# Patient Record
Sex: Male | Born: 1955
Health system: Southern US, Community
[De-identification: ages and names within clinical notes are randomized; demographics above are authoritative.]

## PROBLEM LIST (undated history)

## (undated) DIAGNOSIS — M1990 Unspecified osteoarthritis, unspecified site: Secondary | ICD-10-CM

## (undated) DIAGNOSIS — K219 Gastro-esophageal reflux disease without esophagitis: Secondary | ICD-10-CM

## (undated) DIAGNOSIS — I10 Essential (primary) hypertension: Secondary | ICD-10-CM

## (undated) DIAGNOSIS — E785 Hyperlipidemia, unspecified: Secondary | ICD-10-CM

## (undated) DIAGNOSIS — Z720 Tobacco use: Secondary | ICD-10-CM

## (undated) DIAGNOSIS — E119 Type 2 diabetes mellitus without complications: Secondary | ICD-10-CM

## (undated) DIAGNOSIS — T4145XA Adverse effect of unspecified anesthetic, initial encounter: Secondary | ICD-10-CM

## (undated) DIAGNOSIS — K602 Anal fissure, unspecified: Secondary | ICD-10-CM

## (undated) DIAGNOSIS — M79606 Pain in leg, unspecified: Secondary | ICD-10-CM

## (undated) DIAGNOSIS — E871 Hypo-osmolality and hyponatremia: Secondary | ICD-10-CM

## (undated) DIAGNOSIS — R112 Nausea with vomiting, unspecified: Secondary | ICD-10-CM

## (undated) DIAGNOSIS — Z9889 Other specified postprocedural states: Secondary | ICD-10-CM

## (undated) DIAGNOSIS — F419 Anxiety disorder, unspecified: Secondary | ICD-10-CM

## (undated) DIAGNOSIS — R739 Hyperglycemia, unspecified: Secondary | ICD-10-CM

## (undated) DIAGNOSIS — T8859XA Other complications of anesthesia, initial encounter: Secondary | ICD-10-CM

## (undated) DIAGNOSIS — T7840XA Allergy, unspecified, initial encounter: Secondary | ICD-10-CM

## (undated) DIAGNOSIS — K859 Acute pancreatitis without necrosis or infection, unspecified: Secondary | ICD-10-CM

## (undated) HISTORY — DX: Essential (primary) hypertension: I10

## (undated) HISTORY — DX: Tobacco use: Z72.0

## (undated) HISTORY — PX: HERNIA REPAIR: SHX51

## (undated) HISTORY — DX: Anal fissure, unspecified: K60.2

## (undated) HISTORY — DX: Allergy, unspecified, initial encounter: T78.40XA

## (undated) HISTORY — DX: Unspecified osteoarthritis, unspecified site: M19.90

## (undated) HISTORY — DX: Hyperlipidemia, unspecified: E78.5

## (undated) HISTORY — DX: Gastro-esophageal reflux disease without esophagitis: K21.9

## (undated) HISTORY — DX: Anxiety disorder, unspecified: F41.9

## (undated) HISTORY — DX: Pain in leg, unspecified: M79.606

## (undated) HISTORY — PX: TIBIA FRACTURE SURGERY: SHX806

## (undated) HISTORY — PX: LASER ABLATION: SHX1947

---

## 2010-09-17 ENCOUNTER — Other Ambulatory Visit: Payer: Self-pay | Admitting: Geriatric Medicine

## 2010-09-17 ENCOUNTER — Ambulatory Visit
Admission: RE | Admit: 2010-09-17 | Discharge: 2010-09-17 | Disposition: A | Discharge status: Home / Self Care | Payer: Medicaid Other | Source: Ambulatory Visit | Attending: Geriatric Medicine | Admitting: Geriatric Medicine

## 2010-09-17 DIAGNOSIS — R52 Pain, unspecified: Secondary | ICD-10-CM

## 2011-02-02 ENCOUNTER — Other Ambulatory Visit: Payer: Self-pay | Admitting: Geriatric Medicine

## 2011-02-02 DIAGNOSIS — G43909 Migraine, unspecified, not intractable, without status migrainosus: Secondary | ICD-10-CM

## 2011-02-03 ENCOUNTER — Ambulatory Visit
Admission: RE | Admit: 2011-02-03 | Discharge: 2011-02-03 | Disposition: A | Discharge status: Home / Self Care | Payer: Medicaid Other | Source: Ambulatory Visit | Attending: Geriatric Medicine | Admitting: Geriatric Medicine

## 2011-02-03 DIAGNOSIS — G43909 Migraine, unspecified, not intractable, without status migrainosus: Secondary | ICD-10-CM

## 2011-07-16 ENCOUNTER — Other Ambulatory Visit: Payer: Self-pay

## 2011-07-16 DIAGNOSIS — I83893 Varicose veins of bilateral lower extremities with other complications: Secondary | ICD-10-CM

## 2011-08-20 ENCOUNTER — Encounter: Payer: Self-pay | Admitting: Vascular Surgery

## 2011-08-23 ENCOUNTER — Encounter (INDEPENDENT_AMBULATORY_CARE_PROVIDER_SITE_OTHER): Payer: Medicare Other | Admitting: *Deleted

## 2011-08-23 ENCOUNTER — Ambulatory Visit (INDEPENDENT_AMBULATORY_CARE_PROVIDER_SITE_OTHER): Payer: Medicare Other | Admitting: Vascular Surgery

## 2011-08-23 ENCOUNTER — Encounter: Payer: Self-pay | Admitting: Vascular Surgery

## 2011-08-23 VITALS — BP 146/98 | HR 60 | Resp 16 | Ht 71.0 in | Wt 190.0 lb

## 2011-08-23 DIAGNOSIS — I83893 Varicose veins of bilateral lower extremities with other complications: Secondary | ICD-10-CM

## 2011-08-23 DIAGNOSIS — M79609 Pain in unspecified limb: Secondary | ICD-10-CM

## 2011-08-23 NOTE — Progress Notes (Signed)
Subjective:     Patient ID: Curtis Kim, male   DOB: 1955/10/31, 56 y.o.   MRN: [id number]  HPI this 56 year old male was referred for severe varicose veins in the right leg. He has no history of DVT, thrombophlebitis, bleeding, stasis ulcers, or other complications. He has had large bulging varicose veins in his medial and posterior thigh and posterior calf for the past 20 years which have continued to enlarge and become increasingly symptomatic. He has aching, throbbing, burning, and stinging discomfort which worsens as the day progresses. It is not wear elastic compression stockings nor elevate his legs on a regular basis nor take pain medicine. He has no symptoms in the contralateral left leg. He denies claudication symptoms. He has had no previous evaluation of his venous disease.  Past Medical History  Diagnosis Date  . Hypertension     History  Substance Use Topics  . Smoking status: Current Everyday Smoker -- 0.5 packs/day  . Smokeless tobacco: Not on file  . Alcohol Use: No    History reviewed. No pertinent family history.  No Known Allergies  Current outpatient prescriptions:gabapentin (NEURONTIN) 300 MG capsule, Take 300 mg by mouth 2 (two) times daily., Disp: , Rfl: ;  ranitidine (ZANTAC) 300 MG tablet, Take 300 mg by mouth 2 (two) times daily., Disp: , Rfl: ;  traMADol (ULTRAM) 50 MG tablet, Take 50 mg by mouth every 6 (six) hours as needed., Disp: , Rfl:   BP 146/98  Pulse 60  Resp 16  Ht 5\' 11"  (1.803 m)  Wt 190 lb (86.183 kg)  BMI 26.50 kg/m2  Body mass index is 26.50 kg/(m^2).         Review of Systems denies chest pain, dyspnea on exertion, PND, orthopnea, chronic bronchitis, hemoptysis, lateralizing neurologic symptoms.    Objective:   Physical Exam blood pressure 146/98 heart rate 60 respirations 16 Gen.-alert and oriented x3 in no apparent distress HEENT normal for age Lungs no rhonchi or wheezing Cardiovascular regular rhythm no murmurs carotid  pulses 3+ palpable no bruits audible Abdomen soft nontender no palpable masses Musculoskeletal free of  major deformities Skin clear -no rashes Neurologic normal Lower extremities 3+ femoral and dorsalis pedis pulses palpable bilaterally with no edema Right leg has large nest of bulging varicosities beginning in the lower third of the thigh medially extending posteriorly into the distal thigh. These are large and mildly tender to touch. There is no erythema. He also has a large nest of bulging varicosities in the right posterior calf. There is no hyperpigmentation or ulceration noted. Left leg is free varicosities.  Today I ordered a venous duplex exam of the right leg which are reviewed and interpreted. There is no DVT or deep venous reflux. He has gross reflux in the right great saphenous and small saphenous veins supplying these bulging varicosities.       Assessment:     Severe venous insufficiency right leg was symptomatic bulging varicosities do to reflux in right great and small saphenous veins    Plan:     #1 long-leg elastic compression stockings #2 elevate legs during the day as much as possible #3 ibuprofen on a daily basis #4 return in 3 months. If no significant improvement he will need #1 laser ablation right great saphenous vein followed by #2 laser ablation right small saphenous vein with greater than 20 stab phlebectomy

## 2011-09-06 NOTE — Procedures (Unsigned)
LOWER EXTREMITY VENOUS REFLUX EXAM  INDICATION:  Right lower extremity pain.  EXAM:  Using color-flow imaging and pulse Doppler spectral analysis, the right common femoral, femoral, popliteal, posterior tibial, great and small saphenous veins were evaluated.  There is no evidence suggesting deep venous insufficiency in the right lower extremity.  The right saphenofemoral junction and non-tortuous great saphenous veins demonstrate Reflux of >560millisecondswith calibers as described below.   The right proximal small saphenous vein demonstrates Reflux of >560milliseconds with diameter measurements ranging from 0.26 to 0.82 cm.  GSV Diameter (used if found to be incompetent only)                                           Right    Left Proximal Greater Saphenous Vein           0.64 cm  cm Proximal-to-mid-thigh                     cm       cm Mid thigh                                 0.67 cm  cm Mid-distal thigh                          cm       cm Distal thigh                              0.26 cm  cm Knee                                      0.27 cm  cm  IMPRESSION: 1. Right great and small saphenous vein Reflux, as described above. 2. The deep venous system is not competent.  ___________________________________________ Curtis Kim. Hart Rochester, M.D.  CH/MEDQ  D:  08/25/2011  T:  08/25/2011  Job:  [id number]

## 2011-10-19 ENCOUNTER — Encounter: Payer: Medicaid Other | Admitting: Vascular Surgery

## 2011-10-21 ENCOUNTER — Encounter: Payer: Self-pay | Admitting: Gastroenterology

## 2011-11-19 ENCOUNTER — Encounter: Payer: Self-pay | Admitting: Vascular Surgery

## 2011-11-22 ENCOUNTER — Ambulatory Visit (INDEPENDENT_AMBULATORY_CARE_PROVIDER_SITE_OTHER): Payer: Medicare Other | Admitting: Vascular Surgery

## 2011-11-22 ENCOUNTER — Encounter: Payer: Self-pay | Admitting: Vascular Surgery

## 2011-11-22 VITALS — BP 155/98 | HR 68 | Resp 20 | Ht 71.0 in | Wt 186.0 lb

## 2011-11-22 DIAGNOSIS — I83893 Varicose veins of bilateral lower extremities with other complications: Secondary | ICD-10-CM

## 2011-11-22 NOTE — Progress Notes (Signed)
Subjective:     Patient ID: Curtis Kim, male   DOB: September 03, 1955, 56 y.o.   MRN: [id number]  HPI this 56 year old male returns after 3 months of treatment for his severe bulging varicose veins in the right leg which had been present and enlarging over the past 20 years. He has tried long-leg elastic compression stockings 20-30 mm gradient as well as elevation. He cannot take ibuprofen but is on pain medication. He continues to have slow enlargement of these large bulging varicosities and aching throbbing and burning discomfort which worsens as the day progresses. This is affecting his daily living.  Past Medical History  Diagnosis Date  . Hypertension     History  Substance Use Topics  . Smoking status: Current Every Day Smoker -- 0.5 packs/day  . Smokeless tobacco: Not on file  . Alcohol Use: No    No family history on file.  No Known Allergies  Current outpatient prescriptions:ranitidine (ZANTAC) 300 MG tablet, Take 300 mg by mouth 2 (two) times daily., Disp: , Rfl: ;  traMADol (ULTRAM) 50 MG tablet, Take 50 mg by mouth every 6 (six) hours as needed., Disp: , Rfl: ;  gabapentin (NEURONTIN) 300 MG capsule, Take 300 mg by mouth 2 (two) times daily., Disp: , Rfl:   BP 155/98  Pulse 68  Resp 20  Ht 5\' 11"  (1.803 m)  Wt 186 lb (84.369 kg)  BMI 25.94 kg/m2  Body mass index is 25.94 kg/(m^2).           Review of Systems denies chest pain, dyspnea on exertion, PND, orthopnea, hemoptysis, claudication.     Objective:   Physical Exam blood pressure 150/98 heart rate 68 respirations 20 General well-developed well-nourished male in no apparent stress alert and oriented x3 Lungs no rhonchi or wheezing Right lower extremity with large bulging varicosities beginning in the lower third of the thigh extending posteriorly across the popliteal fossa and into the posterior calf. These are quite extensive. He has 1+ distal edema. 3+ dorsalis pedis pulses present.  Patient has  documented gross reflux right great and small saphenous vein supplying these large bulging varicosities     Assessment:     Severe venous insufficiency right leg with gross reflux right great and small saphenous vein supplying the large bulging varicosities-resistant to conservative measures-affecting patient's daily living    Plan:     Patient needs #1 laser ablation right great saphenous vein followed by #2 laser ablation right small saphenous vein with greater than 20 stab phlebectomy as single procedure

## 2011-11-23 ENCOUNTER — Encounter: Payer: Self-pay | Admitting: Gastroenterology

## 2011-11-23 ENCOUNTER — Other Ambulatory Visit: Payer: Self-pay | Admitting: *Deleted

## 2011-11-23 DIAGNOSIS — I83893 Varicose veins of bilateral lower extremities with other complications: Secondary | ICD-10-CM

## 2011-11-29 ENCOUNTER — Telehealth: Payer: Self-pay | Admitting: *Deleted

## 2011-11-29 NOTE — Telephone Encounter (Signed)
Patient direct sent by Dr.Hooper for screening colonoscopy. Patient denies any GI complaints or problems. Patient does states family history colon cancer in father at age 54 and mother stomach cancer. Patient states last colonoscopy was at age 20,3 years ago at [facility] while he was incarcerated. He believes he had polyps removed but not sure how many. After I explained this to Dr.Stark verbal order given to cancel procedure and get records from [facility] for review before colonoscopy done. Explained to patient. Encouraged patient to call us back if he has not heard from [place] in 2 weeks. Release of information completed and signed by patient and given to Amanda,CMA.

## 2011-11-30 NOTE — Telephone Encounter (Signed)
Faxed record release to [employer] and awaiting on records.

## 2011-12-02 ENCOUNTER — Telehealth: Payer: Self-pay | Admitting: Gastroenterology

## 2011-12-02 NOTE — Telephone Encounter (Signed)
Recall entered  °

## 2011-12-02 NOTE — Telephone Encounter (Signed)
Records from prior colonoscopy on 02/20/2008 at [facility] reviewed. He had one small adenomatous polyp, two small hyperplastic polyps and internal/external hemorrhoids. Follow up colonoscopy in 5 years was recommended and I agree with this recommendation. Colon recall 01/2013.

## 2011-12-02 NOTE — Telephone Encounter (Signed)
Forward 6 pages from [facility] to Dr. Claudette Head for review on 12-02-11 ym

## 2011-12-06 ENCOUNTER — Encounter: Payer: Medicaid Other | Admitting: Gastroenterology

## 2011-12-10 ENCOUNTER — Encounter: Payer: Self-pay | Admitting: Vascular Surgery

## 2011-12-13 ENCOUNTER — Encounter: Payer: Self-pay | Admitting: Vascular Surgery

## 2011-12-13 ENCOUNTER — Ambulatory Visit (INDEPENDENT_AMBULATORY_CARE_PROVIDER_SITE_OTHER): Payer: Medicaid Other | Admitting: Vascular Surgery

## 2011-12-13 VITALS — BP 145/85 | HR 70 | Resp 16 | Ht 71.0 in | Wt 186.0 lb

## 2011-12-13 DIAGNOSIS — I83893 Varicose veins of bilateral lower extremities with other complications: Secondary | ICD-10-CM

## 2011-12-13 NOTE — Progress Notes (Signed)
Laser Ablation Procedure      Date: 12/13/2011    Curtis Kim DOB:Mar 13, 1955  Consent signed: Yes  Surgeon:J.D. Hart Rochester  Procedure: Laser Ablation: right Greater Saphenous Vein  BP 145/85  Pulse 70  Resp 16  Ht 5\' 11"  (1.803 m)  Wt 186 lb (84.369 kg)  BMI 25.94 kg/m2  Start time: 9:30   End time: 10:00  Tumescent Anesthesia: 200 cc 0.9% NaCl with 50 cc Lidocaine HCL with 1% Epi and 15 cc 8.4% NaHCO3  Local Anesthesia: 3 cc Lidocaine HCL and NaHCO3 (ratio 2:1)  Pulsed mode: Watts 15 Seconds 1 Pulses:1 Total Pulses:93 Total Energy: 1395 Total Time: 1:33     Patient tolerated procedure well: Yes  Notes:   Description of Procedure:  After marking the course of the saphenous vein and the secondary varicosities in the standing position, the patient was placed on the operating table in the supine position, and the right leg was prepped and draped in sterile fashion. Local anesthetic was administered, and under ultrasound guidance the saphenous vein was accessed with a micro needle and guide wire; then the micro puncture sheath was placed. A guide wire was inserted to the saphenofemoral junction, followed by a 5 french sheath.  The position of the sheath and then the laser fiber below the junction was confirmed using the ultrasound and visualization of the aiming beam.  Tumescent anesthesia was administered along the course of the saphenous vein using ultrasound guidance. Protective laser glasses were placed on the patient, and the laser was fired at 15 watt pulsed mode advancing 1-2 mm per sec.  For a total of 1395 joules.  A steri strip was applied to the puncture site.    ABD pads and thigh high compression stockings were applied.  Ace wrap bandages were applied over the phlebectomy sites and at the top of the saphenofemoral junction.  Blood loss was less than 15 cc.  The patient ambulated out of the operating room having tolerated the procedure well.

## 2011-12-13 NOTE — Progress Notes (Signed)
Subjective:     Patient ID: Curtis Kim, male   DOB: [birth year], 56 y.o.   MRN: [id number]  HPI this 56 year old male had laser ablation of the right great saphenous vein from the distal thigh the saphenofemoral junction under local tumescent anesthesia. A total of 1395 J of energy was utilized. He tolerated the procedure well.  Review of Systems     Objective:   Physical ExamBP 145/85  Pulse 70  Resp 16  Ht 5\' 11"  (1.803 m)  Wt 186 lb (84.369 kg)  BMI 25.94 kg/m2      Assessment:    well-tolerated laser ablation right great saphenous vein performed under local tumescent anesthesia for venous hypertension with painful bulging varicosities    Plan:     Plan return in one week for venous duplex exam to confirm closure right great saphenous vein. Patient will then have laser ablation right small saphenous with multiple stab phlebectomy

## 2011-12-14 ENCOUNTER — Encounter: Payer: Self-pay | Admitting: Vascular Surgery

## 2011-12-14 ENCOUNTER — Telehealth: Payer: Self-pay | Admitting: *Deleted

## 2011-12-14 NOTE — Telephone Encounter (Signed)
Reached the patient's sister. He needed clarification on when the stocking comes off. I went over everything and told her he went home with an instructions sheet. She states he is not in pain and doing well. Reminded her of his fu appt next week.

## 2011-12-20 ENCOUNTER — Encounter: Payer: Self-pay | Admitting: Vascular Surgery

## 2011-12-21 ENCOUNTER — Ambulatory Visit (INDEPENDENT_AMBULATORY_CARE_PROVIDER_SITE_OTHER): Payer: Medicaid Other | Admitting: Vascular Surgery

## 2011-12-21 ENCOUNTER — Encounter (INDEPENDENT_AMBULATORY_CARE_PROVIDER_SITE_OTHER): Payer: Medicaid Other | Admitting: *Deleted

## 2011-12-21 ENCOUNTER — Encounter: Payer: Self-pay | Admitting: Vascular Surgery

## 2011-12-21 ENCOUNTER — Other Ambulatory Visit: Payer: Self-pay | Admitting: *Deleted

## 2011-12-21 VITALS — BP 133/84 | HR 62 | Resp 20 | Ht 71.0 in | Wt 187.0 lb

## 2011-12-21 DIAGNOSIS — I83893 Varicose veins of bilateral lower extremities with other complications: Secondary | ICD-10-CM

## 2011-12-21 DIAGNOSIS — Z48812 Encounter for surgical aftercare following surgery on the circulatory system: Secondary | ICD-10-CM

## 2011-12-21 NOTE — Progress Notes (Signed)
Subjective:     Patient ID: Curtis Kim, male   DOB: December 10, 1955, 56 y.o.   MRN: [id number]  HPI this 56 year old male returns 1 week post laser ablation right great saphenous vein for painful varicosities due to gross reflux. This was performed under local tumescent anesthesia. He has had mild to moderate discomfort along the course of the great saphenous vein from the mid thigh to the saphenofemoral junction. He denies any chest pain dyspnea on exertion or hemoptysis. He has had minimal distal edema since the procedure. He continues to have pain in the posterior calf over the bulging varicosities.  Past Medical History  Diagnosis Date  . Hypertension     History  Substance Use Topics  . Smoking status: Current Every Day Smoker -- 0.5 packs/day  . Smokeless tobacco: Never Used  . Alcohol Use: No    Family History  Problem Relation Age of Onset  . Cancer Mother     colon  . Cancer Father     pancreas    No Known Allergies  Current outpatient prescriptions:gabapentin (NEURONTIN) 300 MG capsule, Take 300 mg by mouth 2 (two) times daily., Disp: , Rfl: ;  ranitidine (ZANTAC) 300 MG tablet, Take 300 mg by mouth 2 (two) times daily., Disp: , Rfl: ;  traMADol (ULTRAM) 50 MG tablet, Take 50 mg by mouth every 6 (six) hours as needed., Disp: , Rfl:   BP 133/84  Pulse 62  Resp 20  Ht 5\' 11"  (1.803 m)  Wt 187 lb (84.823 kg)  BMI 26.08 kg/m2  Body mass index is 26.08 kg/(m^2).          Review of Systems     Objective:   Physical ExamBP 133/84  Pulse 62  Resp 20  Ht 5\' 11"  (1.803 m)  Wt 187 lb (84.823 kg)  BMI 26.08 kg/m2  General well-developed well-nourished male no apparent distress Lungs no rhonchi or wheezing Right lower extremity with moderate tenderness along the course the great saphenous vein from the distal thigh to the saphenofemoral junction. Some bulging varicosities in the medial and posterior thigh from the great saphenous system are thrombosed. The  bulging varicosities over the small saphenous system in the posterior calf continued to be compressible and tense and not thrombosed. No active ulcers are noted.  Today I ordered a venous duplex exam of the right leg which are reviewed and interpreted. Great saphenous vein is totally occluded up to near the saphenofemoral junction with no DVT.     Assessment:     Successful laser ablation right great saphenous vein for gross reflux with painful varicosities and thrombosis of some secondary varicosities and distal thigh    Plan:     Plan laser ablation right small saphenous vein with stab phlebectomy on November 11

## 2012-01-07 ENCOUNTER — Encounter: Payer: Self-pay | Admitting: Vascular Surgery

## 2012-01-10 ENCOUNTER — Encounter: Payer: Self-pay | Admitting: Vascular Surgery

## 2012-01-10 ENCOUNTER — Ambulatory Visit (INDEPENDENT_AMBULATORY_CARE_PROVIDER_SITE_OTHER): Payer: Medicaid Other | Admitting: Vascular Surgery

## 2012-01-10 VITALS — BP 166/103 | HR 80 | Resp 18 | Ht 71.0 in | Wt 187.0 lb

## 2012-01-10 DIAGNOSIS — I83893 Varicose veins of bilateral lower extremities with other complications: Secondary | ICD-10-CM

## 2012-01-10 NOTE — Progress Notes (Signed)
Subjective:     Patient ID: Curtis Kim, male   DOB: October 05, 1955, 56 y.o.   MRN: [id number]  HPI this 56 year old male had laser ablation of the right small saphenous vein from the mid calf to the popliteal fossa performed under local tumescent anesthesia. He also had 10-20 stab phlebectomy of secondary varicosities performed at the same setting. Patient previously had large bulging varicosities in the posterior thigh but following laser ablation of the right great saphenous veins and these thrombosed spontaneously. He tolerated the procedure well.  Review of Systems     Objective:   Physical ExamBP 166/103  Pulse 80  Resp 18  Ht 5\' 11"  (1.803 m)  Wt 187 lb (84.823 kg)  BMI 26.08 kg/m2      Assessment:     Laser ablation right small saphenous vein +10-20 stab phlebectomy performed under local tumescent anesthesia for painful varicosities    Plan:     Return November 19 for venous duplex exam right leg to confirm closure right small saphenous vein.

## 2012-01-10 NOTE — Progress Notes (Signed)
Laser Ablation Procedure      Date: 01/10/2012    Konnor Vondrasek DOB:13-Jul-1955  Consent signed: Yes  Surgeon:J.D. Hart Rochester  Procedure: Laser Ablation: right Small Saphenous Vein  BP 166/103  Pulse 80  Resp 18  Ht 5\' 11"  (1.803 m)  Wt 187 lb (84.823 kg)  BMI 26.08 kg/m2  Start time: 1:00   End time: 2:00  Tumescent Anesthesia: 200 cc 0.9% NaCl with 50 cc Lidocaine HCL with 1% Epi and 15 cc 8.4% NaHCO3  Local Anesthesia: 8 cc Lidocaine HCL and NaHCO3 (ratio 2:1)  Pulsed mode: Watts 15 Seconds 1 Pulses:1 Total Pulses:52 Total Energy: 780 Total Time: :52     Stab Phlebectomy: 10-20 Sites: Calf  Patient tolerated procedure well: Yes  Notes:   Description of Procedure:  After marking the course of the saphenous vein and the secondary varicosities in the standing position, the patient was placed on the operating table in the prone position, and the right leg was prepped and draped in sterile fashion. Local anesthetic was administered, and under ultrasound guidance the saphenous vein was accessed with a micro needle and guide wire; then the micro puncture sheath was placed. A guide wire was inserted to the saphenopopliteal junction, followed by a 5 french sheath.  The position of the sheath and then the laser fiber below the junction was confirmed using the ultrasound and visualization of the aiming beam.  Tumescent anesthesia was administered along the course of the saphenous vein using ultrasound guidance. Protective laser glasses were placed on the patient, and the laser was fired at 15 watt pulsed mode advancing 1-2 mm per sec.  For a total of 780 joules.  A steri strip was applied to the puncture site.  The patient was then put into Trendelenburg position.  Local anesthetic was utilized overlying the marked varicosities.  Ten to 20 stab wounds were made using the tip of an 11 blade; and using the vein hook,  The phlebectomies were performed using a hemostat to avulse these  varicosities.  Adequate hemostasis was achieved, and steri strips were applied to the stab wound.      ABD pads and thigh high compression stockings were applied.  Ace wrap bandages were applied over the phlebectomy sites and at the top of the saphenopopliteal junction.  Blood loss was less than 15 cc.  The patient ambulated out of the operating room having tolerated the procedure well.

## 2012-01-11 ENCOUNTER — Telehealth: Payer: Self-pay | Admitting: *Deleted

## 2012-01-11 ENCOUNTER — Encounter: Payer: Self-pay | Admitting: Vascular Surgery

## 2012-01-11 NOTE — Telephone Encounter (Signed)
Spoke with the patient's sister. He is doing well. A ltitle sore but following all instructions. Talked to her about ice and Ibuprofen. Reminded her of his fu appt next week.

## 2012-01-17 ENCOUNTER — Encounter: Payer: Self-pay | Admitting: Vascular Surgery

## 2012-01-18 ENCOUNTER — Ambulatory Visit (INDEPENDENT_AMBULATORY_CARE_PROVIDER_SITE_OTHER): Payer: Medicaid Other | Admitting: Vascular Surgery

## 2012-01-18 ENCOUNTER — Encounter: Payer: Self-pay | Admitting: Vascular Surgery

## 2012-01-18 ENCOUNTER — Ambulatory Visit (INDEPENDENT_AMBULATORY_CARE_PROVIDER_SITE_OTHER): Payer: Medicare Other | Admitting: Vascular Surgery

## 2012-01-18 VITALS — BP 145/94 | HR 76 | Resp 16 | Ht 71.0 in | Wt 188.0 lb

## 2012-01-18 DIAGNOSIS — I83893 Varicose veins of bilateral lower extremities with other complications: Secondary | ICD-10-CM

## 2012-01-18 DIAGNOSIS — I83819 Varicose veins of unspecified lower extremities with pain: Secondary | ICD-10-CM

## 2012-01-18 DIAGNOSIS — M7989 Other specified soft tissue disorders: Secondary | ICD-10-CM

## 2012-01-18 DIAGNOSIS — I83813 Varicose veins of bilateral lower extremities with pain: Secondary | ICD-10-CM

## 2012-01-18 DIAGNOSIS — M79609 Pain in unspecified limb: Secondary | ICD-10-CM

## 2012-01-18 NOTE — Progress Notes (Signed)
Right lower extremity venous duplex post ablation performed @ [facility] 01/18/2012

## 2012-01-18 NOTE — Progress Notes (Signed)
Subjective:     Patient ID: Curtis Kim, male   DOB: [birth year], 56 y.o.   MRN: [id number]  HPI this 56 year old male returns one week following laser ablation right small saphenous vein plus multiple stab phlebectomy for painful varicosities performed under local tumescent anesthesia. He tolerated the procedure well. He has had mild discomfort in the posterior calf and no significant distal edema. He has been wearing his stocking as prescribed. He had previously undergone laser ablation of the right great saphenous vein. Review of Systems     Objective:   Physical ExamBP 145/94  Pulse 76  Resp 16  Ht 5\' 11"  (1.803 m)  Wt 188 lb (85.276 kg)  BMI 26.22 kg/m2  General well-developed well-nourished male in no apparent stress alert and oriented x3 Right leg examined with nicely healing stab phlebectomy sites posterior calf. Mild discomfort over small saphenous vein. Thrombosed varicosities in the distal posterior thigh connecting with great saphenous system.  Today I ordered a venous duplex exam of the right leg which are viewed and interpreted. A small saphenous and great saphenous veins are totally closed with no DVT.    Assessment:     Successful laser ablation right small and great saphenous vein with multiple stab phlebectomy for painful varicosities    Plan:     Return to see [place] on when necessary basis.

## 2012-11-22 ENCOUNTER — Encounter: Payer: Self-pay | Admitting: Gastroenterology

## 2012-12-25 ENCOUNTER — Encounter: Payer: Self-pay | Admitting: Gastroenterology

## 2013-03-06 ENCOUNTER — Encounter: Payer: Self-pay | Admitting: Gastroenterology

## 2013-03-15 ENCOUNTER — Ambulatory Visit (AMBULATORY_SURGERY_CENTER): Payer: Self-pay | Admitting: *Deleted

## 2013-03-15 VITALS — Ht 71.0 in | Wt 201.4 lb

## 2013-03-15 DIAGNOSIS — Z86010 Personal history of colonic polyps: Secondary | ICD-10-CM

## 2013-03-15 MED ORDER — MOVIPREP 100 G PO SOLR: ORAL | Status: DC

## 2013-03-15 NOTE — Progress Notes (Signed)
No allergies to eggs or soy. No problems with anesthesia.  

## 2013-03-20 ENCOUNTER — Encounter: Payer: Medicare Other | Admitting: Gastroenterology

## 2013-04-11 ENCOUNTER — Encounter: Payer: Self-pay | Admitting: Gastroenterology

## 2013-04-11 ENCOUNTER — Ambulatory Visit (AMBULATORY_SURGERY_CENTER): Payer: Medicare Other | Admitting: Gastroenterology

## 2013-04-11 VITALS — BP 126/83 | HR 68 | Temp 97.8°F | Resp 14 | Ht 71.0 in | Wt 201.0 lb

## 2013-04-11 DIAGNOSIS — D126 Benign neoplasm of colon, unspecified: Secondary | ICD-10-CM

## 2013-04-11 DIAGNOSIS — Z800 Family history of malignant neoplasm of digestive organs: Secondary | ICD-10-CM

## 2013-04-11 DIAGNOSIS — Z86010 Personal history of colonic polyps: Secondary | ICD-10-CM

## 2013-04-11 MED ORDER — SODIUM CHLORIDE 0.9 % IV SOLN: 500.0000 mL | INTRAVENOUS | Status: DC

## 2013-04-11 NOTE — Progress Notes (Signed)
  [place] [place] [facility] Post-op Note  Patient: Curtis Kim  Procedure(s) Performed: colonoscopy  Patient Location: [facility] - Recovery Area  Anesthesia Type: Deep Sedation/Propofol  Level of Consciousness: awake, oriented and patient cooperative  Airway and Oxygen Therapy: Patient Spontanous Breathing  Post-op Pain: none  Post-op Assessment:  Post-op Vital signs reviewed, Patient's Cardiovascular Status Stable, Respiratory Function Stable, Patent Airway, No signs of Nausea or vomiting and Pain level controlled  Post-op Vital Signs: Reviewed and stable  Complications: No apparent anesthesia complications  Iniya Matzek E 3:27 PM

## 2013-04-11 NOTE — Progress Notes (Signed)
Called to room to assist during endoscopic procedure.  Patient ID and intended procedure confirmed with present staff. Received instructions for my participation in the procedure from the performing physician. ewm 

## 2013-04-11 NOTE — Op Note (Signed)
[place] [place]  [address]. [place], [address]   COLONOSCOPY PROCEDURE REPORT  PATIENT: Curtis, Kim  MR#: [id number] BIRTHDATE: 1955/04/29 , 74  yrs. old GENDER: Male ENDOSCOPIST: Ladene Artist, MD, Marval Regal REFERRED BY:  York Ram, M.D. PROCEDURE DATE:  04/11/2013 PROCEDURE:   Colonoscopy with snare polypectomy First Screening Colonoscopy - Avg.  risk and is 50 yrs.  old or older - No.  Prior Negative Screening - Now for repeat screening. Above average risk  History of Adenoma - Now for follow-up colonoscopy & has been > or = to 3 yrs.  Yes hx of adenoma.  Has been 3 or more years since last colonoscopy.  Polyps Removed Today? Yes. ASA CLASS:   Class II INDICATIONS:Patient's personal history of adenomatous colon polyps and Patient's immediate family history of colon cancer. MEDICATIONS: MAC sedation, administered by CRNA and propofol (Diprivan) 200mg  IV DESCRIPTION OF PROCEDURE:   After the risks benefits and alternatives of the procedure were thoroughly explained, informed consent was obtained.  A digital rectal exam revealed no abnormalities of the rectum.   The LB [id number]  endoscope was introduced through the anus and advanced to the cecum, which was identified by both the appendix and ileocecal valve. No adverse events experienced.   The quality of the prep was adequate, using MoviPrep  The instrument was then slowly withdrawn as the colon was fully examined.  COLON FINDINGS: Two sessile polyps measuring 5 mm in size were found in the descending colon and sigmoid colon.  A polypectomy was performed with a cold snare.  The resection was complete and the polyp tissue was completely retrieved.   The colon was otherwise normal.  There was no diverticulosis, inflammation, polyps or cancers unless previously stated.  Retroflexed views revealed small internal hemorrhoids. The time to cecum=5 minutes 25 seconds. Withdrawal time=11 minutes 49  seconds.  The scope was withdrawn and the procedure completed. COMPLICATIONS: There were no complications.  ENDOSCOPIC IMPRESSION: 1.   Two sessile polyps measuring 5 mm in the descending and sigmoid colon; polypectomy performed with a cold snare 2.   Small internal hemorrhoids  RECOMMENDATIONS: 1.  Await pathology results 2.  Repeat Colonoscopy in 5 years with a more extensive bowel prep  eSigned:  Ladene Artist, MD, [facility] 04/11/2013 3:27 PM

## 2013-04-11 NOTE — Patient Instructions (Signed)

## 2013-04-12 ENCOUNTER — Telehealth: Payer: Self-pay

## 2013-04-12 NOTE — Telephone Encounter (Signed)
  Follow up Call-  Call back number 04/11/2013  Post procedure Call Back phone  # [phone]  Permission to leave phone message Yes     Patient questions:  Do you have a fever, pain , or abdominal swelling? no Pain Score  0 *  Have you tolerated food without any problems? yes  Have you been able to return to your normal activities? yes  Do you have any questions about your discharge instructions: Diet   no Medications  no Follow up visit  no  Do you have questions or concerns about your Care? no  Actions: * If pain score is 4 or above: No action needed, pain <4.   No problems per the pt. Maw

## 2013-04-16 ENCOUNTER — Encounter: Payer: Self-pay | Admitting: Gastroenterology

## 2013-08-22 ENCOUNTER — Encounter (HOSPITAL_COMMUNITY): Payer: Self-pay | Admitting: Emergency Medicine

## 2013-08-22 ENCOUNTER — Emergency Department (HOSPITAL_COMMUNITY)
Admission: EM | Admit: 2013-08-22 | Discharge: 2013-08-22 | Disposition: A | Discharge status: Home / Self Care | Payer: Medicare Other | Attending: Emergency Medicine | Admitting: Emergency Medicine

## 2013-08-22 DIAGNOSIS — E785 Hyperlipidemia, unspecified: Secondary | ICD-10-CM | POA: Insufficient documentation

## 2013-08-22 DIAGNOSIS — F411 Generalized anxiety disorder: Secondary | ICD-10-CM | POA: Diagnosis not present

## 2013-08-22 DIAGNOSIS — K219 Gastro-esophageal reflux disease without esophagitis: Secondary | ICD-10-CM | POA: Insufficient documentation

## 2013-08-22 DIAGNOSIS — I10 Essential (primary) hypertension: Secondary | ICD-10-CM | POA: Insufficient documentation

## 2013-08-22 DIAGNOSIS — F17200 Nicotine dependence, unspecified, uncomplicated: Secondary | ICD-10-CM | POA: Insufficient documentation

## 2013-08-22 DIAGNOSIS — R21 Rash and other nonspecific skin eruption: Secondary | ICD-10-CM | POA: Insufficient documentation

## 2013-08-22 DIAGNOSIS — Z79899 Other long term (current) drug therapy: Secondary | ICD-10-CM | POA: Diagnosis not present

## 2013-08-22 DIAGNOSIS — Z8739 Personal history of other diseases of the musculoskeletal system and connective tissue: Secondary | ICD-10-CM | POA: Diagnosis not present

## 2013-08-22 MED ORDER — KETOCONAZOLE 1 % EX SHAM: 5.0000 mL | MEDICATED_SHAMPOO | Freq: Every day | CUTANEOUS | Status: DC

## 2013-08-22 MED ORDER — HYDROCORTISONE 1 % EX CREA: TOPICAL_CREAM | CUTANEOUS | Status: DC

## 2013-08-22 MED ORDER — DIPHENHYDRAMINE HCL 25 MG PO TABS: 25.0000 mg | ORAL_TABLET | Freq: Four times a day (QID) | ORAL | Status: DC

## 2013-08-22 NOTE — Discharge Instructions (Signed)
Please use the medications prescribed and followup with a primary care provider for continued evaluation and treatment of your rash.    Rash A rash is a change in the color or texture of your skin. There are many different types of rashes. You may have other problems that accompany your rash. CAUSES   Infections.  Allergic reactions. This can include allergies to pets or foods.  Certain medicines.  Exposure to certain chemicals, soaps, or cosmetics.  Heat.  Exposure to poisonous plants.  Tumors, both cancerous and noncancerous. SYMPTOMS   Redness.  Scaly skin.  Itchy skin.  Dry or cracked skin.  Bumps.  Blisters.  Pain. DIAGNOSIS  Your caregiver may do a physical exam to determine what type of rash you have. A skin sample (biopsy) may be taken and examined under a microscope. TREATMENT  Treatment depends on the type of rash you have. Your caregiver may prescribe certain medicines. For serious conditions, you may need to see a skin doctor (dermatologist). HOME CARE INSTRUCTIONS   Avoid the substance that caused your rash.  Do not scratch your rash. This can cause infection.  You may take cool baths to help stop itching.  Only take over-the-counter or prescription medicines as directed by your caregiver.  Keep all follow-up appointments as directed by your caregiver. SEEK IMMEDIATE MEDICAL CARE IF:  You have increasing pain, swelling, or redness.  You have a fever.  You have new or severe symptoms.  You have body aches, diarrhea, or vomiting.  Your rash is not better after 3 days. MAKE SURE YOU:  Understand these instructions.  Will watch your condition.  Will get help right away if you are not doing well or get worse. Document Released: 02/05/2002 Document Revised: 05/10/2011 Document Reviewed: 11/30/2010 [facility] Patient Information 2015 [place], [place]. This information is not intended to replace advice given to you by your health care provider.  Make sure you discuss any questions you have with your health care provider.

## 2013-08-22 NOTE — ED Notes (Signed)
Pt ambulatory to exam room with steady gait. Pt has no acute distress.

## 2013-08-22 NOTE — ED Provider Notes (Signed)
Medical screening examination/treatment/procedure(s) were performed by non-physician practitioner and as supervising physician I was immediately available for consultation/collaboration.   EKG Interpretation None        Blanchie Dessert, MD 08/22/13 [phone]

## 2013-08-22 NOTE — ED Notes (Signed)
Pt states he thinks he is having an allergic reaction  Pt states he was seen for this before and was given atarax for the itching  Pt states it slowed down for a bit but now it is back  Pt states he went to his dr today but could not get in today  Pt states it is itching and feels like something is biting on him

## 2013-08-22 NOTE — ED Provider Notes (Signed)
CSN: [id number]     Arrival date & time 08/22/13  2107 History  This chart was scribed for non-physician practitioner, Hazel Sams, PA-C,working with Blanchie Dessert, MD, by Marlowe Kays, ED Scribe.  This patient was seen in room WTR5/WTR5 and the patient's care was started at 11:09 PM.  Chief Complaint  Patient presents with  . Allergic Reaction   The history is provided by the patient. No language interpreter was used.   HPI Comments:  Curtis Kim is a 58 y.o. male who presents to the Emergency Department complaining of itching that he believes is from an allergic reaction that started six days ago. He states he was seen by his PCP and was prescribed Atarax but states the itching has gotten worse. He states he was given an "allergy shot" that helped the itching some. He states it feels like he is being bitten by something. He reports using a new soap. He states he also ate tuna but has never had an allergy to that before. He denies fever, nausea, or vomiting. He states his PCP is on [employer].  Past Medical History  Diagnosis Date  . Hypertension   . GERD (gastroesophageal reflux disease)   . Leg pain   . Allergy   . Arthritis   . Hyperlipidemia   . Anxiety    Past Surgical History  Procedure Laterality Date  . Hernia repair      right and left  . Laser ablation      right leg   Family History  Problem Relation Age of Onset  . Cancer Mother     colon  . Cancer Father     pancreas  . Colon cancer Father 52   History  Substance Use Topics  . Smoking status: Current Every Day Smoker -- 0.50 packs/day  . Smokeless tobacco: Never Used  . Alcohol Use: No    Review of Systems  Constitutional: Negative for fever.  Gastrointestinal: Negative for nausea and vomiting.  Skin: Positive for rash.  All other systems reviewed and are negative.   Allergies  Review of patient's allergies indicates no known allergies.  Home Medications   Prior to Admission  medications   Medication Sig Start Date End Date Taking? Authorizing Provider  diazepam (VALIUM) 2 MG tablet Take 2 mg by mouth every 12 (twelve) hours as needed for anxiety.  03/24/13  Yes Historical Provider, MD  hydrOXYzine (ATARAX/VISTARIL) 25 MG tablet Take 25 mg by mouth every 6 (six) hours as needed for itching.   Yes Historical Provider, MD  lisinopril-hydrochlorothiazide (PRINZIDE,ZESTORETIC) 10-12.5 MG per tablet Take 1 tablet by mouth 2 (two) times daily.   Yes Historical Provider, MD  ranitidine (ZANTAC) 300 MG tablet Take 300 mg by mouth 2 (two) times daily.   Yes Historical Provider, MD  Rosuvastatin Calcium (CRESTOR PO) Take by mouth daily.   Yes Historical Provider, MD  traMADol (ULTRAM) 50 MG tablet Take 50 mg by mouth every 6 (six) hours as needed for moderate pain.    Yes Historical Provider, MD   Triage Vitals: BP 166/92  Pulse 74  Temp(Src) 98.5 F (36.9 C) (Oral)  Resp 20  SpO2 98% Physical Exam  Nursing note and vitals reviewed. Constitutional: He is oriented to person, place, and time. He appears well-developed and well-nourished.  HENT:  Head: Normocephalic and atraumatic.  Mouth/Throat: Oropharynx is clear and moist.  Eyes: EOM are normal.  Neck: Normal range of motion.  Cardiovascular: Normal rate.   Pulmonary/Chest: Effort  normal. No respiratory distress. He has no wheezes. He has no rales.  Musculoskeletal: Normal range of motion.  Neurological: He is alert and oriented to person, place, and time.  Skin: Skin is warm and dry.  Few erythematous macules to the mid upper back. Some secondary excoriations. 2 small erythematous macules to the right lower leg. No vesicles or pustules. No induration or secondary signs of skin infection.  Psychiatric: He has a normal mood and affect. His behavior is normal.    ED Course  Procedures  DIAGNOSTIC STUDIES: Oxygen Saturation is 98% on RA, normal by my interpretation.   COORDINATION OF CARE: 11:14 PM- Will  prescribe lotion and shampoo for itching. Pt verbalizes understanding and agrees to plan.   MDM   Final diagnoses:  Rash      I personally performed the services described in this documentation, which was scribed in my presence. The recorded information has been reviewed and is accurate.    Martie Lee, PA-C 08/22/13 2333

## 2015-03-19 ENCOUNTER — Encounter (HOSPITAL_COMMUNITY): Payer: Self-pay | Admitting: Emergency Medicine

## 2015-03-19 ENCOUNTER — Emergency Department (HOSPITAL_COMMUNITY)
Admission: EM | Admit: 2015-03-19 | Discharge: 2015-03-20 | Disposition: A | Discharge status: Home / Self Care | Payer: Medicare Other | Attending: Emergency Medicine | Admitting: Emergency Medicine

## 2015-03-19 ENCOUNTER — Emergency Department (HOSPITAL_COMMUNITY): Payer: Medicare Other

## 2015-03-19 DIAGNOSIS — F17200 Nicotine dependence, unspecified, uncomplicated: Secondary | ICD-10-CM | POA: Insufficient documentation

## 2015-03-19 DIAGNOSIS — K219 Gastro-esophageal reflux disease without esophagitis: Secondary | ICD-10-CM | POA: Diagnosis not present

## 2015-03-19 DIAGNOSIS — I10 Essential (primary) hypertension: Secondary | ICD-10-CM | POA: Diagnosis not present

## 2015-03-19 DIAGNOSIS — R079 Chest pain, unspecified: Secondary | ICD-10-CM | POA: Insufficient documentation

## 2015-03-19 DIAGNOSIS — Z79899 Other long term (current) drug therapy: Secondary | ICD-10-CM | POA: Insufficient documentation

## 2015-03-19 DIAGNOSIS — Z8739 Personal history of other diseases of the musculoskeletal system and connective tissue: Secondary | ICD-10-CM | POA: Diagnosis not present

## 2015-03-19 DIAGNOSIS — E785 Hyperlipidemia, unspecified: Secondary | ICD-10-CM | POA: Insufficient documentation

## 2015-03-19 DIAGNOSIS — F419 Anxiety disorder, unspecified: Secondary | ICD-10-CM | POA: Diagnosis not present

## 2015-03-19 LAB — CBC WITH DIFFERENTIAL/PLATELET
BASOS ABS: 0 10*3/uL (ref 0.0–0.1)
BASOS PCT: 0 %
Eosinophils Absolute: 0.3 10*3/uL (ref 0.0–0.7)
Eosinophils Relative: 4 %
HEMATOCRIT: 40.9 % (ref 39.0–52.0)
HEMOGLOBIN: 14.4 g/dL (ref 13.0–17.0)
LYMPHS PCT: 33 %
Lymphs Abs: 2.1 10*3/uL (ref 0.7–4.0)
MCH: 31.3 pg (ref 26.0–34.0)
MCHC: 35.2 g/dL (ref 30.0–36.0)
MCV: 88.9 fL (ref 78.0–100.0)
MONOS PCT: 11 %
Monocytes Absolute: 0.7 10*3/uL (ref 0.1–1.0)
NEUTROS ABS: 3.4 10*3/uL (ref 1.7–7.7)
NEUTROS PCT: 52 %
Platelets: 135 10*3/uL — ABNORMAL LOW (ref 150–400)
RBC: 4.6 MIL/uL (ref 4.22–5.81)
RDW: 13.3 % (ref 11.5–15.5)
WBC: 6.5 10*3/uL (ref 4.0–10.5)

## 2015-03-19 LAB — BASIC METABOLIC PANEL
ANION GAP: 12 (ref 5–15)
BUN: 7 mg/dL (ref 6–20)
CO2: 23 mmol/L (ref 22–32)
Calcium: 9.1 mg/dL (ref 8.9–10.3)
Chloride: 101 mmol/L (ref 101–111)
Creatinine, Ser: 1.01 mg/dL (ref 0.61–1.24)
GFR calc Af Amer: >60 mL/min (ref >60)
Glucose, Bld: 160 mg/dL — ABNORMAL HIGH (ref 65–99)
POTASSIUM: 3.7 mmol/L (ref 3.5–5.1)
SODIUM: 136 mmol/L (ref 135–145)

## 2015-03-19 LAB — I-STAT TROPONIN, ED: Troponin i, poc: 0 ng/mL (ref 0.00–0.08)

## 2015-03-19 MED ORDER — LORAZEPAM 2 MG/ML IJ SOLN
0.5000 mg | Freq: Once | INTRAMUSCULAR | Status: AC
  Administered 2015-03-19: 0.5 mg via INTRAVENOUS
  Filled 2015-03-19: qty 1

## 2015-03-19 NOTE — ED Notes (Signed)
Patient transported to X-ray 

## 2015-03-19 NOTE — ED Provider Notes (Signed)
CSN: [id number]     Arrival date & time 03/19/15  2113 History   First MD Initiated Contact with Patient 03/19/15 2113     Chief Complaint  Patient presents with  . Chest Pain     (Consider location/radiation/quality/duration/timing/severity/associated sxs/prior Treatment) HPI   Patient has a PMH of anxiety, hyperlipidemia, arthritis, allergies, varicose veins and hypertension.   He reports having acute onset of his heart racing, followed by difficulty catching his breath and feeling like the room was caving in. He then started to have sensation in his chest which he can best describe as shaking followed by the sensation shooting up into his head and causing his vision to black out for a second, no LOC. He also reports dull ache and shaky. His sister called EMS who brought him here to the ER. He denies feeling anxious about anything. EMS gave 324 aspirin and 1 nitro tab. His BP went from 180/120 down to 139/81 and the patient "supposes those medicines helped relieve his symptoms" but he isn't sure. He is not having any pain but still feels shaky.  PCP: No primary care provider on file.  ROS: The patient denies diaphoresis, fever, headache, weakness (general or focal), confusion, change of vision,  dysphagia, aphagia,  abdominal pains, nausea, vomiting, diarrhea, lower extremity swelling, rash, neck pain,   Past Medical History  Diagnosis Date  . Hypertension   . GERD (gastroesophageal reflux disease)   . Leg pain   . Allergy   . Arthritis   . Hyperlipidemia   . Anxiety    Past Surgical History  Procedure Laterality Date  . Hernia repair      right and left  . Laser ablation      right leg   Family History  Problem Relation Age of Onset  . Cancer Mother     colon  . Cancer Father     pancreas  . Colon cancer Father 49   Social History  Substance Use Topics  . Smoking status: Current Every Day Smoker -- 0.50 packs/day  . Smokeless tobacco: Never Used  . Alcohol Use: No     Review of Systems  Review of Systems All other systems negative except as documented in the HPI. All pertinent positives and negatives as reviewed in the HPI.   Allergies  Review of patient's allergies indicates no known allergies.  Home Medications   Prior to Admission medications   Medication Sig Start Date End Date Taking? Authorizing Provider  clonazePAM (KLONOPIN) 1 MG tablet Take 1 mg by mouth 2 (two) times daily as needed for anxiety.   Yes Historical Provider, MD  hydrOXYzine (ATARAX/VISTARIL) 25 MG tablet Take 25 mg by mouth every 6 (six) hours as needed for itching.   Yes Historical Provider, MD  lisinopril-hydrochlorothiazide (PRINZIDE,ZESTORETIC) 10-12.5 MG per tablet Take 1 tablet by mouth 2 (two) times daily.   Yes Historical Provider, MD  ranitidine (ZANTAC) 300 MG tablet Take 300 mg by mouth 2 (two) times daily.   Yes Historical Provider, MD  rosuvastatin (CRESTOR) 20 MG tablet Take 20 mg by mouth daily.   Yes Historical Provider, MD  sertraline (ZOLOFT) 25 MG tablet Take 25 mg by mouth daily.   Yes Historical Provider, MD  topiramate (TOPAMAX) 200 MG tablet Take 200 mg by mouth daily.   Yes Historical Provider, MD  traMADol (ULTRAM) 50 MG tablet Take 50 mg by mouth every 6 (six) hours as needed for moderate pain.    Yes Historical Provider, MD  diphenhydrAMINE (BENADRYL) 25 MG tablet Take 1 tablet (25 mg total) by mouth every 6 (six) hours. Patient not taking: Reported on 03/19/2015 08/22/13   Hazel Sams, PA-C  hydrocortisone cream 1 % Apply to affected area 2 times daily Patient not taking: Reported on 03/19/2015 08/22/13   Hazel Sams, PA-C  KETOCONAZOLE, TOPICAL, 1 % SHAM Apply 5 mLs topically daily. Patient not taking: Reported on 03/19/2015 08/22/13   Hazel Sams, PA-C   BP 126/77 mmHg  Pulse 72  Temp(Src) 98.9 F (37.2 C) (Oral)  Resp 25  SpO2 95% Physical Exam  Constitutional: He appears well-developed and well-nourished. No distress.  HENT:  Head:  Normocephalic and atraumatic.  Right Ear: Tympanic membrane and ear canal normal.  Left Ear: Tympanic membrane and ear canal normal.  Nose: Nose normal.  Mouth/Throat: Uvula is midline, oropharynx is clear and moist and mucous membranes are normal.  Eyes: Pupils are equal, round, and reactive to light.  Neck: Normal range of motion. Neck supple.  Cardiovascular: Normal rate and regular rhythm.   Pulmonary/Chest: Effort normal.  Abdominal: Soft.  No signs of abdominal distention  Musculoskeletal:  No LE swelling  Neurological: He is alert.  Acting at baseline  Skin: Skin is warm and dry. No rash noted.  Nursing note and vitals reviewed.   ED Course  Procedures (including critical care time) Labs Review Labs Reviewed  CBC WITH DIFFERENTIAL/PLATELET - Abnormal; Notable for the following:    Platelets 135 (*)    All other components within normal limits  BASIC METABOLIC PANEL - Abnormal; Notable for the following:    Glucose, Bld 160 (*)    All other components within normal limits  CBG MONITORING, ED - Abnormal; Notable for the following:    Glucose-Capillary 125 (*)    All other components within normal limits  TROPONIN I  Randolm Idol, ED    Imaging Review Dg Chest 2 View  03/19/2015  CLINICAL DATA:  Left-sided chest pain for 1 day. History of hypertension, smoker. EXAM: CHEST  2 VIEW COMPARISON:  None. FINDINGS: Heart size is upper normal. Overall cardiomediastinal silhouette is within normal limits in size and configuration. Lung volumes are upper normal. Lungs are clear. No pleural effusions seen. No pneumothorax. Mild degenerative changes seen within the thoracic spine. No acute osseous abnormality. IMPRESSION: Lungs are clear and there is no evidence of acute cardiopulmonary abnormality. Electronically Signed   By: Franki Cabot M.D.   On: 03/19/2015 21:52   I have personally reviewed and evaluated these images and lab results as part of my medical decision-making.    EKG Interpretation   Date/Time:  Wednesday March 19 2015 22:02:59 EST Ventricular Rate:  72 PR Interval:  172 QRS Duration: 102 QT Interval:  398 QTC Calculation: 435 R Axis:   70 Text Interpretation:  Age not entered, assumed to be  60 years old for  purpose of ECG interpretation Sinus rhythm Consider left atrial  enlargement No old tracing to compare Confirmed by [facility]  MD, Nunzio Cory  [phone]) on 03/19/2015 10:12:31 PM      MDM   Final diagnoses:  Chest pain, unspecified chest pain type    Patient has a heart score of 2: Due to age and two risk factors of hypertension and hyperlipidemia.  His Troponins x2 are negative in the ER. He has continued to be pain free. His chest xray and EKG are unremarkable for acute abnormalities.  Patient is to be discharged with recommendation to follow up with PCP  in regards to today's hospital visit. Chest pain is not likely of cardiac or pulmonary etiology d/t presentation, perc negative, VSS, no tracheal deviation, no JVD or new murmur, RRR, breath sounds equal bilaterally, EKG without acute abnormalities, negative troponin, and negative CXR. Pt has been advised return to the ED if CP becomes exertional, associated with diaphoresis or nausea, radiates to left jaw/arm, worsens or becomes concerning in any way. Recommend discuss with PCP about his anxiety. Pt appears reliable for follow up and is agreeable to discharge.    Delos Haring, PA-C 03/23/15 [place], PA-C 04/13/15 [place], DO 04/13/15 2111

## 2015-03-19 NOTE — ED Notes (Addendum)
Pt c/o his heart racing and his head is "acting up". Pt is unable to verbalize what he means by "acting up". HR at this time is 74. Pt has been anxious since he got here. Given 0.5mg  lorazepam at 2248. Tiffany PA aware.

## 2015-03-19 NOTE — ED Notes (Signed)
CBG was 125

## 2015-03-19 NOTE — ED Notes (Signed)
Per EMS, pt from home with c/o chest pain/pressure radiating to the left arm and jaw. Pt also c/o severe headache. Per pt, HA common when his BP is elevated. Given 324 asa and 1 nitro with EMS. Pt denies chest pain at this time. Initial BP 180/120, BP upon arrival 139/81.

## 2015-03-20 DIAGNOSIS — R079 Chest pain, unspecified: Secondary | ICD-10-CM | POA: Diagnosis not present

## 2015-03-20 LAB — CBG MONITORING, ED: Glucose-Capillary: 125 mg/dL — ABNORMAL HIGH (ref 65–99)

## 2015-03-20 LAB — TROPONIN I

## 2015-03-20 MED ORDER — OXYCODONE-ACETAMINOPHEN 5-325 MG PO TABS
1.0000 | ORAL_TABLET | Freq: Once | ORAL | Status: AC
  Administered 2015-03-20: 1 via ORAL
  Filled 2015-03-20: qty 1

## 2015-03-20 MED ORDER — ONDANSETRON 4 MG PO TBDP
4.0000 mg | ORAL_TABLET | Freq: Once | ORAL | Status: AC
  Administered 2015-03-20: 4 mg via ORAL
  Filled 2015-03-20: qty 1

## 2015-03-20 NOTE — Discharge Instructions (Signed)

## 2015-03-20 NOTE — ED Notes (Signed)
Pt given a bus pass and will wait in the lobby until the buses begin running again this am.

## 2016-06-01 ENCOUNTER — Encounter (HOSPITAL_COMMUNITY): Payer: Self-pay | Admitting: Emergency Medicine

## 2016-06-01 ENCOUNTER — Emergency Department (HOSPITAL_COMMUNITY)
Admission: EM | Admit: 2016-06-01 | Discharge: 2016-06-01 | Disposition: A | Discharge status: Home / Self Care | Payer: Medicare Other | Attending: Emergency Medicine | Admitting: Emergency Medicine

## 2016-06-01 DIAGNOSIS — I10 Essential (primary) hypertension: Secondary | ICD-10-CM | POA: Diagnosis not present

## 2016-06-01 DIAGNOSIS — K6289 Other specified diseases of anus and rectum: Secondary | ICD-10-CM | POA: Diagnosis not present

## 2016-06-01 DIAGNOSIS — F17200 Nicotine dependence, unspecified, uncomplicated: Secondary | ICD-10-CM | POA: Insufficient documentation

## 2016-06-01 DIAGNOSIS — Z79899 Other long term (current) drug therapy: Secondary | ICD-10-CM | POA: Diagnosis not present

## 2016-06-01 MED ORDER — LIDOCAINE HCL 2 % EX GEL
1.0000 "application " | Freq: Once | CUTANEOUS | Status: AC
  Administered 2016-06-01: 1 via TOPICAL
  Filled 2016-06-01: qty 20

## 2016-06-01 MED ORDER — HYDROCORTISONE ACETATE 25 MG RE SUPP: 25.0000 mg | Freq: Two times a day (BID) | RECTAL | 0 refills | Status: DC

## 2016-06-01 MED ORDER — DOCUSATE SODIUM 100 MG PO CAPS: 100.0000 mg | ORAL_CAPSULE | Freq: Two times a day (BID) | ORAL | 0 refills | Status: AC

## 2016-06-01 NOTE — ED Triage Notes (Signed)
Pt sts pain and bleeding from hemorrhoids x 3 days

## 2016-06-01 NOTE — ED Provider Notes (Signed)
[facility] DEPT Provider Note   CSN: [id number] Arrival date & time: 06/01/16  1204     History   Chief Complaint Chief Complaint  Patient presents with  . Hemorrhoids    HPI Curtis Kim is a 61 y.o. male who presents with rectal pain. He states that over the past 3 days he has had worsening rectal pain which he attributes to hemorrhoids. He states he has not been constipated but the pain started after he had a BM and had to strain. He had a colonoscopy in 2015 which revealed polyps and small internal hemorrhoids. He saw a provider who prescribed proctofoam and has been using this with minimal relief. He denies fever, chills, abdominal pain, N/V/D, constipation, dysuria, melena, hematochezia. He reports having hemorrhoids in the past which had to be "lasered" and this pain feels similar.  HPI  Past Medical History:  Diagnosis Date  . Allergy   . Anxiety   . Arthritis   . GERD (gastroesophageal reflux disease)   . Hyperlipidemia   . Hypertension   . Leg pain     Patient Active Problem List   Diagnosis Date Noted  . Varicose veins of both lower extremities with pain 01/18/2012  . Varicose veins of lower extremities with other complications 13/24/4010    Past Surgical History:  Procedure Laterality Date  . HERNIA REPAIR     right and left  . LASER ABLATION     right leg       Home Medications    Prior to Admission medications   Medication Sig Start Date End Date Taking? Authorizing Provider  clonazePAM (KLONOPIN) 1 MG tablet Take 1 mg by mouth 2 (two) times daily as needed for anxiety.    Historical Provider, MD  diphenhydrAMINE (BENADRYL) 25 MG tablet Take 1 tablet (25 mg total) by mouth every 6 (six) hours. Patient not taking: Reported on 03/19/2015 08/22/13   Hazel Sams, PA-C  hydrocortisone cream 1 % Apply to affected area 2 times daily Patient not taking: Reported on 03/19/2015 08/22/13   Hazel Sams, PA-C  hydrOXYzine (ATARAX/VISTARIL) 25 MG tablet  Take 25 mg by mouth every 6 (six) hours as needed for itching.    Historical Provider, MD  KETOCONAZOLE, TOPICAL, 1 % SHAM Apply 5 mLs topically daily. Patient not taking: Reported on 03/19/2015 08/22/13   Hazel Sams, PA-C  lisinopril-hydrochlorothiazide (PRINZIDE,ZESTORETIC) 10-12.5 MG per tablet Take 1 tablet by mouth 2 (two) times daily.    Historical Provider, MD  ranitidine (ZANTAC) 300 MG tablet Take 300 mg by mouth 2 (two) times daily.    Historical Provider, MD  rosuvastatin (CRESTOR) 20 MG tablet Take 20 mg by mouth daily.    Historical Provider, MD  sertraline (ZOLOFT) 25 MG tablet Take 25 mg by mouth daily.    Historical Provider, MD  topiramate (TOPAMAX) 200 MG tablet Take 200 mg by mouth daily.    Historical Provider, MD  traMADol (ULTRAM) 50 MG tablet Take 50 mg by mouth every 6 (six) hours as needed for moderate pain.     Historical Provider, MD    Family History Family History  Problem Relation Age of Onset  . Cancer Mother     colon  . Cancer Father     pancreas  . Colon cancer Father 6    Social History Social History  Substance Use Topics  . Smoking status: Current Every Day Smoker    Packs/day: 0.50  . Smokeless tobacco: Never Used  . Alcohol use No  Allergies   Patient has no known allergies.   Review of Systems Review of Systems  Constitutional: Negative for fever.  Gastrointestinal: Positive for rectal pain. Negative for abdominal pain, blood in stool, constipation, diarrhea and vomiting.  Genitourinary: Negative for dysuria.  All other systems reviewed and are negative.    Physical Exam Updated Vital Signs BP 128/88 (BP Location: Right Arm)   Pulse 86   Temp 98.1 F (36.7 C) (Oral)   Resp 18   SpO2 94%   Physical Exam  Constitutional: He is oriented to person, place, and time. He appears well-developed and well-nourished. No distress.  HENT:  Head: Normocephalic and atraumatic.  Eyes: Conjunctivae are normal. Pupils are equal,  round, and reactive to light. Right eye exhibits no discharge. Left eye exhibits no discharge. No scleral icterus.  Neck: Normal range of motion.  Cardiovascular: Normal rate.   Pulmonary/Chest: Effort normal. No respiratory distress.  Abdominal: Soft. Bowel sounds are normal. He exhibits no distension and no mass. There is no tenderness. There is no rebound and no guarding. No hernia.  Genitourinary:  Genitourinary Comments: Rectal: He has tenderness in the 6 o clock region and an internal hemorrhoid in the area. No gross blood, fissures, redness, area of fluctuance, lesions. Chaperone present during exam.   Neurological: He is alert and oriented to person, place, and time.  Skin: Skin is warm and dry.  Psychiatric: He has a normal mood and affect. His behavior is normal.  Nursing note and vitals reviewed.    ED Treatments / Results  Labs (all labs ordered are listed, but only abnormal results are displayed) Labs Reviewed - No data to display  EKG  EKG Interpretation None       Radiology No results found.  Procedures Procedures (including critical care time)  Medications Ordered in ED Medications - No data to display   Initial Impression / Assessment and Plan / ED Course  I have reviewed the triage vital signs and the nursing notes.  Pertinent labs & imaging results that were available during my care of the patient were reviewed by me and considered in my medical decision making (see chart for details).  61 year old male presents with a rectal pain likely due to internal hemorrhoid. Vitals are normal. Abdomen is benign, non-tender. Will rx suppository and stool softener. Advised sitz baths. Referral to general surgery given. Return precautions given.  Final Clinical Impressions(s) / ED Diagnoses   Final diagnoses:  Rectal pain    New Prescriptions New Prescriptions   No medications on file     Recardo Evangelist, PA-C 06/02/16 Moenkopi Yao,  MD 06/02/16 [phone]

## 2016-06-01 NOTE — Discharge Instructions (Signed)
Use suppository daily Take colace twice a day (stool softener) Follow up with general surgery

## 2016-07-28 ENCOUNTER — Ambulatory Visit (INDEPENDENT_AMBULATORY_CARE_PROVIDER_SITE_OTHER): Payer: Medicare Other | Admitting: Nurse Practitioner

## 2016-07-28 ENCOUNTER — Encounter: Payer: Self-pay | Admitting: Nurse Practitioner

## 2016-07-28 VITALS — BP 104/72 | HR 78 | Ht 69.0 in | Wt 175.0 lb

## 2016-07-28 DIAGNOSIS — K602 Anal fissure, unspecified: Secondary | ICD-10-CM

## 2016-07-28 MED ORDER — DILTIAZEM GEL 2 %: 1.0000 "application " | Freq: Three times a day (TID) | CUTANEOUS | 2 refills | Status: DC

## 2016-07-28 MED ORDER — DILTIAZEM GEL 2 %: 1.0000 "application " | Freq: Three times a day (TID) | CUTANEOUS | 0 refills | Status: DC
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
## 2016-07-28 NOTE — Patient Instructions (Addendum)
If you are age 59 or older, your body mass index should be between 23-30. Your Body mass index is 25.84 kg/m. If this is out of the aforementioned range listed, please consider follow up with your Primary Care Provider.  If you are age 27 or younger, your body mass index should be between 19-25. Your Body mass index is 25.84 kg/m. If this is out of the aformentioned range listed, please consider follow up with your Primary Care Provider.   We have sent the following medications to your [facility] for you to pick up at your convenience: Diltiazem   Thank you for choosing me and [facility].   Tye Savoy, NP

## 2016-07-30 NOTE — Progress Notes (Signed)
Reviewed and agree with management plan.  Malcolm T. Stark, MD FACG 

## 2016-08-29 DIAGNOSIS — E871 Hypo-osmolality and hyponatremia: Secondary | ICD-10-CM

## 2016-08-29 DIAGNOSIS — R739 Hyperglycemia, unspecified: Secondary | ICD-10-CM

## 2016-08-29 HISTORY — DX: Hyperglycemia, unspecified: R73.9

## 2016-08-29 HISTORY — DX: Hypo-osmolality and hyponatremia: E87.1

## 2016-09-06 ENCOUNTER — Encounter (HOSPITAL_COMMUNITY): Payer: Self-pay | Admitting: Emergency Medicine

## 2016-09-06 DIAGNOSIS — F10239 Alcohol dependence with withdrawal, unspecified: Secondary | ICD-10-CM | POA: Diagnosis present

## 2016-09-06 DIAGNOSIS — R1011 Right upper quadrant pain: Secondary | ICD-10-CM | POA: Diagnosis not present

## 2016-09-06 DIAGNOSIS — K852 Alcohol induced acute pancreatitis without necrosis or infection: Principal | ICD-10-CM | POA: Diagnosis present

## 2016-09-06 DIAGNOSIS — N2889 Other specified disorders of kidney and ureter: Secondary | ICD-10-CM | POA: Diagnosis present

## 2016-09-06 DIAGNOSIS — E785 Hyperlipidemia, unspecified: Secondary | ICD-10-CM | POA: Diagnosis present

## 2016-09-06 DIAGNOSIS — E871 Hypo-osmolality and hyponatremia: Secondary | ICD-10-CM | POA: Diagnosis present

## 2016-09-06 DIAGNOSIS — F17210 Nicotine dependence, cigarettes, uncomplicated: Secondary | ICD-10-CM | POA: Diagnosis present

## 2016-09-06 DIAGNOSIS — K8689 Other specified diseases of pancreas: Secondary | ICD-10-CM | POA: Diagnosis present

## 2016-09-06 DIAGNOSIS — G8929 Other chronic pain: Secondary | ICD-10-CM | POA: Diagnosis present

## 2016-09-06 DIAGNOSIS — F418 Other specified anxiety disorders: Secondary | ICD-10-CM | POA: Diagnosis present

## 2016-09-06 DIAGNOSIS — I10 Essential (primary) hypertension: Secondary | ICD-10-CM | POA: Diagnosis present

## 2016-09-06 DIAGNOSIS — K219 Gastro-esophageal reflux disease without esophagitis: Secondary | ICD-10-CM | POA: Diagnosis present

## 2016-09-06 DIAGNOSIS — Z800 Family history of malignant neoplasm of digestive organs: Secondary | ICD-10-CM

## 2016-09-06 DIAGNOSIS — E1165 Type 2 diabetes mellitus with hyperglycemia: Secondary | ICD-10-CM | POA: Diagnosis present

## 2016-09-06 LAB — URINALYSIS, ROUTINE W REFLEX MICROSCOPIC
BACTERIA UA: NONE SEEN
BILIRUBIN URINE: NEGATIVE
Glucose, UA: >=500 mg/dL — AB
Hgb urine dipstick: NEGATIVE
KETONES UR: NEGATIVE mg/dL
LEUKOCYTES UA: NEGATIVE
NITRITE: NEGATIVE
Protein, ur: NEGATIVE mg/dL
RBC / HPF: NONE SEEN RBC/hpf (ref 0–5)
Specific Gravity, Urine: 1.028 (ref 1.005–1.030)
WBC UA: NONE SEEN WBC/hpf (ref 0–5)
pH: 7 (ref 5.0–8.0)

## 2016-09-06 LAB — CBC
HEMATOCRIT: 44.1 % (ref 39.0–52.0)
Hemoglobin: 15.7 g/dL (ref 13.0–17.0)
MCH: 31.5 pg (ref 26.0–34.0)
MCHC: 35.6 g/dL (ref 30.0–36.0)
MCV: 88.6 fL (ref 78.0–100.0)
Platelets: 182 10*3/uL (ref 150–400)
RBC: 4.98 MIL/uL (ref 4.22–5.81)
RDW: 12.7 % (ref 11.5–15.5)
WBC: 10.4 10*3/uL (ref 4.0–10.5)

## 2016-09-06 LAB — LIPASE, BLOOD: LIPASE: 276 U/L — AB (ref 11–51)

## 2016-09-06 LAB — COMPREHENSIVE METABOLIC PANEL
ALBUMIN: 4.1 g/dL (ref 3.5–5.0)
ALT: 15 U/L — ABNORMAL LOW (ref 17–63)
ANION GAP: 9 (ref 5–15)
AST: 16 U/L (ref 15–41)
Alkaline Phosphatase: 121 U/L (ref 38–126)
BILIRUBIN TOTAL: 0.5 mg/dL (ref 0.3–1.2)
BUN: 11 mg/dL (ref 6–20)
CHLORIDE: 91 mmol/L — AB (ref 101–111)
CO2: 27 mmol/L (ref 22–32)
Calcium: 9.5 mg/dL (ref 8.9–10.3)
Creatinine, Ser: 0.78 mg/dL (ref 0.61–1.24)
GFR calc Af Amer: >60 mL/min (ref >60)
GFR calc non Af Amer: >60 mL/min (ref >60)
GLUCOSE: 395 mg/dL — AB (ref 65–99)
POTASSIUM: 4.3 mmol/L (ref 3.5–5.1)
Sodium: 127 mmol/L — ABNORMAL LOW (ref 135–145)
TOTAL PROTEIN: 6.9 g/dL (ref 6.5–8.1)

## 2016-09-06 NOTE — ED Triage Notes (Signed)
Pt c/o 9/10 generalized abd pain with some burning sensation, pt has hx of GERD, he is having nausea but not vomiting.

## 2016-09-07 ENCOUNTER — Inpatient Hospital Stay (HOSPITAL_COMMUNITY): Payer: Medicare Other

## 2016-09-07 ENCOUNTER — Inpatient Hospital Stay (HOSPITAL_COMMUNITY)
Admission: EM | Admit: 2016-09-07 | Discharge: 2016-09-09 | DRG: 439 | Disposition: A | Discharge status: Home / Self Care | Payer: Medicare Other | Attending: Family Medicine | Admitting: Family Medicine

## 2016-09-07 ENCOUNTER — Encounter (HOSPITAL_COMMUNITY): Payer: Self-pay | Admitting: General Practice

## 2016-09-07 DIAGNOSIS — F418 Other specified anxiety disorders: Secondary | ICD-10-CM

## 2016-09-07 DIAGNOSIS — F17210 Nicotine dependence, cigarettes, uncomplicated: Secondary | ICD-10-CM | POA: Diagnosis present

## 2016-09-07 DIAGNOSIS — E119 Type 2 diabetes mellitus without complications: Secondary | ICD-10-CM | POA: Diagnosis not present

## 2016-09-07 DIAGNOSIS — F10239 Alcohol dependence with withdrawal, unspecified: Secondary | ICD-10-CM | POA: Diagnosis present

## 2016-09-07 DIAGNOSIS — E1165 Type 2 diabetes mellitus with hyperglycemia: Secondary | ICD-10-CM | POA: Diagnosis present

## 2016-09-07 DIAGNOSIS — N2889 Other specified disorders of kidney and ureter: Secondary | ICD-10-CM | POA: Diagnosis present

## 2016-09-07 DIAGNOSIS — E871 Hypo-osmolality and hyponatremia: Secondary | ICD-10-CM

## 2016-09-07 DIAGNOSIS — K852 Alcohol induced acute pancreatitis without necrosis or infection: Secondary | ICD-10-CM | POA: Diagnosis not present

## 2016-09-07 DIAGNOSIS — E785 Hyperlipidemia, unspecified: Secondary | ICD-10-CM

## 2016-09-07 DIAGNOSIS — E109 Type 1 diabetes mellitus without complications: Secondary | ICD-10-CM

## 2016-09-07 DIAGNOSIS — R739 Hyperglycemia, unspecified: Secondary | ICD-10-CM | POA: Diagnosis not present

## 2016-09-07 DIAGNOSIS — G8929 Other chronic pain: Secondary | ICD-10-CM | POA: Diagnosis not present

## 2016-09-07 DIAGNOSIS — K859 Acute pancreatitis without necrosis or infection, unspecified: Secondary | ICD-10-CM

## 2016-09-07 DIAGNOSIS — K8689 Other specified diseases of pancreas: Secondary | ICD-10-CM

## 2016-09-07 DIAGNOSIS — R109 Unspecified abdominal pain: Secondary | ICD-10-CM

## 2016-09-07 DIAGNOSIS — I10 Essential (primary) hypertension: Secondary | ICD-10-CM

## 2016-09-07 DIAGNOSIS — R1011 Right upper quadrant pain: Secondary | ICD-10-CM | POA: Diagnosis present

## 2016-09-07 DIAGNOSIS — K219 Gastro-esophageal reflux disease without esophagitis: Secondary | ICD-10-CM | POA: Diagnosis present

## 2016-09-07 DIAGNOSIS — Z800 Family history of malignant neoplasm of digestive organs: Secondary | ICD-10-CM | POA: Diagnosis not present

## 2016-09-07 HISTORY — DX: Hyperglycemia, unspecified: R73.9

## 2016-09-07 HISTORY — DX: Hypo-osmolality and hyponatremia: E87.1

## 2016-09-07 HISTORY — DX: Other complications of anesthesia, initial encounter: T88.59XA

## 2016-09-07 HISTORY — DX: Acute pancreatitis without necrosis or infection, unspecified: K85.90

## 2016-09-07 HISTORY — DX: Other specified postprocedural states: R11.2

## 2016-09-07 HISTORY — DX: Adverse effect of unspecified anesthetic, initial encounter: T41.45XA

## 2016-09-07 HISTORY — DX: Other specified postprocedural states: Z98.890

## 2016-09-07 LAB — GLUCOSE, CAPILLARY
Glucose-Capillary: 261 mg/dL — ABNORMAL HIGH (ref 65–99)
Glucose-Capillary: 268 mg/dL — ABNORMAL HIGH (ref 65–99)
Glucose-Capillary: 271 mg/dL — ABNORMAL HIGH (ref 65–99)
Glucose-Capillary: 171 mg/dL — ABNORMAL HIGH (ref 65–99)

## 2016-09-07 LAB — LACTATE DEHYDROGENASE: LDH: 202 U/L — ABNORMAL HIGH (ref 98–192)

## 2016-09-07 MED ORDER — LACTATED RINGERS IV BOLUS (SEPSIS)
1000.0000 mL | Freq: Once | INTRAVENOUS | Status: AC
  Administered 2016-09-07: 1000 mL via INTRAVENOUS

## 2016-09-07 MED ORDER — FAMOTIDINE 10 MG PO TABS
10.0000 mg | ORAL_TABLET | Freq: Every day | ORAL | Status: DC
Start: 2016-09-07 — End: 2016-09-09
  Administered 2016-09-07 – 2016-09-09 (×3): 10 mg via ORAL
  Filled 2016-09-07 (×3): qty 1

## 2016-09-07 MED ORDER — IMIPRAMINE HCL 50 MG PO TABS: 50.0000 mg | ORAL_TABLET | Freq: Every day | ORAL | Status: DC | PRN

## 2016-09-07 MED ORDER — FOLIC ACID 5 MG/ML IJ SOLN
1.0000 mg | Freq: Every day | INTRAMUSCULAR | Status: DC
  Filled 2016-09-07: qty 0.2

## 2016-09-07 MED ORDER — ASPIRIN 81 MG PO CHEW
81.0000 mg | CHEWABLE_TABLET | Freq: Every day | ORAL | Status: DC
  Administered 2016-09-08 – 2016-09-09 (×2): 81 mg via ORAL
  Filled 2016-09-07 (×2): qty 1

## 2016-09-07 MED ORDER — LISINOPRIL-HYDROCHLOROTHIAZIDE 10-12.5 MG PO TABS: 1.0000 | ORAL_TABLET | Freq: Two times a day (BID) | ORAL | Status: DC

## 2016-09-07 MED ORDER — CLONAZEPAM 1 MG PO TABS
1.0000 mg | ORAL_TABLET | Freq: Two times a day (BID) | ORAL | Status: DC | PRN
  Administered 2016-09-09: 1 mg via ORAL
  Filled 2016-09-07: qty 1

## 2016-09-07 MED ORDER — INSULIN ASPART 100 UNIT/ML ~~LOC~~ SOLN
0.0000 [IU] | Freq: Every day | SUBCUTANEOUS | Status: DC
  Administered 2016-09-08: 2 [IU] via SUBCUTANEOUS

## 2016-09-07 MED ORDER — SODIUM CHLORIDE 0.45 % IV SOLN
INTRAVENOUS | Status: DC
  Administered 2016-09-07 – 2016-09-08 (×5): via INTRAVENOUS

## 2016-09-07 MED ORDER — METHOCARBAMOL 1000 MG/10ML IJ SOLN
500.0000 mg | Freq: Four times a day (QID) | INTRAVENOUS | Status: DC | PRN
  Filled 2016-09-07: qty 5

## 2016-09-07 MED ORDER — INSULIN ASPART 100 UNIT/ML ~~LOC~~ SOLN
0.0000 [IU] | Freq: Three times a day (TID) | SUBCUTANEOUS | Status: DC
  Administered 2016-09-07: 5 [IU] via SUBCUTANEOUS
  Administered 2016-09-08: 3 [IU] via SUBCUTANEOUS
  Administered 2016-09-08 (×2): 5 [IU] via SUBCUTANEOUS
  Administered 2016-09-09: 7 [IU] via SUBCUTANEOUS
  Administered 2016-09-09: 2 [IU] via SUBCUTANEOUS

## 2016-09-07 MED ORDER — HYDROCHLOROTHIAZIDE 12.5 MG PO CAPS
12.5000 mg | ORAL_CAPSULE | Freq: Two times a day (BID) | ORAL | Status: DC
  Administered 2016-09-07 – 2016-09-09 (×4): 12.5 mg via ORAL
  Filled 2016-09-07 (×4): qty 1

## 2016-09-07 MED ORDER — HYDRALAZINE HCL 20 MG/ML IJ SOLN: 5.0000 mg | INTRAMUSCULAR | Status: DC | PRN

## 2016-09-07 MED ORDER — VITAMIN B-1 100 MG PO TABS
100.0000 mg | ORAL_TABLET | Freq: Every day | ORAL | Status: DC
  Administered 2016-09-07 – 2016-09-09 (×3): 100 mg via ORAL
  Filled 2016-09-07 (×3): qty 1

## 2016-09-07 MED ORDER — ALBUTEROL SULFATE (2.5 MG/3ML) 0.083% IN NEBU: 2.5000 mg | INHALATION_SOLUTION | RESPIRATORY_TRACT | Status: DC | PRN

## 2016-09-07 MED ORDER — MORPHINE SULFATE (PF) 4 MG/ML IV SOLN
8.0000 mg | Freq: Once | INTRAVENOUS | Status: AC
  Administered 2016-09-07: 8 mg via INTRAVENOUS
  Filled 2016-09-07: qty 2

## 2016-09-07 MED ORDER — DILTIAZEM GEL 2 %: 1.0000 "application " | Freq: Every day | CUTANEOUS | Status: DC | PRN

## 2016-09-07 MED ORDER — KETOROLAC TROMETHAMINE 30 MG/ML IJ SOLN
30.0000 mg | Freq: Four times a day (QID) | INTRAMUSCULAR | Status: DC | PRN
Start: 2016-09-07 — End: 2016-09-09
  Administered 2016-09-07: 30 mg via INTRAVENOUS
  Filled 2016-09-07: qty 1

## 2016-09-07 MED ORDER — PANTOPRAZOLE SODIUM 40 MG PO TBEC
40.0000 mg | DELAYED_RELEASE_TABLET | Freq: Every day | ORAL | Status: DC
  Administered 2016-09-07 – 2016-09-09 (×3): 40 mg via ORAL
  Filled 2016-09-07 (×3): qty 1

## 2016-09-07 MED ORDER — PROMETHAZINE HCL 25 MG/ML IJ SOLN: 12.5000 mg | Freq: Three times a day (TID) | INTRAMUSCULAR | Status: DC | PRN

## 2016-09-07 MED ORDER — CYCLOBENZAPRINE HCL 10 MG PO TABS: 10.0000 mg | ORAL_TABLET | Freq: Two times a day (BID) | ORAL | Status: DC | PRN

## 2016-09-07 MED ORDER — ONDANSETRON HCL 4 MG/2ML IJ SOLN
4.0000 mg | Freq: Once | INTRAMUSCULAR | Status: AC
  Administered 2016-09-07: 4 mg via INTRAVENOUS
  Filled 2016-09-07: qty 2

## 2016-09-07 MED ORDER — HYDROMORPHONE HCL 1 MG/ML IJ SOLN
1.0000 mg | INTRAMUSCULAR | Status: DC | PRN
  Administered 2016-09-07: 1 mg via INTRAVENOUS
  Filled 2016-09-07: qty 1

## 2016-09-07 MED ORDER — LISINOPRIL 10 MG PO TABS
10.0000 mg | ORAL_TABLET | Freq: Two times a day (BID) | ORAL | Status: DC
  Administered 2016-09-07 – 2016-09-09 (×4): 10 mg via ORAL
  Filled 2016-09-07 (×4): qty 1

## 2016-09-07 MED ORDER — SERTRALINE HCL 100 MG PO TABS
100.0000 mg | ORAL_TABLET | Freq: Every day | ORAL | Status: DC
  Administered 2016-09-07: 100 mg via ORAL
  Filled 2016-09-07 (×3): qty 1

## 2016-09-07 MED ORDER — THIAMINE HCL 100 MG/ML IJ SOLN: 100.0000 mg | Freq: Every day | INTRAMUSCULAR | Status: DC

## 2016-09-07 MED ORDER — ARIPIPRAZOLE 10 MG PO TABS
5.0000 mg | ORAL_TABLET | Freq: Every day | ORAL | Status: DC
  Administered 2016-09-07 – 2016-09-08 (×2): 5 mg via ORAL
  Filled 2016-09-07 (×2): qty 1

## 2016-09-07 MED ORDER — FOLIC ACID 1 MG PO TABS
1.0000 mg | ORAL_TABLET | Freq: Every day | ORAL | Status: DC
  Administered 2016-09-07 – 2016-09-09 (×3): 1 mg via ORAL
  Filled 2016-09-07 (×3): qty 1

## 2016-09-07 MED ORDER — ROSUVASTATIN CALCIUM 20 MG PO TABS
20.0000 mg | ORAL_TABLET | Freq: Every day | ORAL | Status: DC
  Administered 2016-09-07 – 2016-09-09 (×3): 20 mg via ORAL
  Filled 2016-09-07 (×3): qty 1

## 2016-09-07 MED ORDER — HEPARIN SODIUM (PORCINE) 5000 UNIT/ML IJ SOLN
5000.0000 [IU] | Freq: Three times a day (TID) | INTRAMUSCULAR | Status: DC
  Administered 2016-09-07 – 2016-09-09 (×6): 5000 [IU] via SUBCUTANEOUS
  Filled 2016-09-07 (×4): qty 1

## 2016-09-07 MED ORDER — HYDROXYZINE HCL 25 MG PO TABS: 25.0000 mg | ORAL_TABLET | Freq: Four times a day (QID) | ORAL | Status: DC | PRN

## 2016-09-07 MED ORDER — DILTIAZEM GEL 2 %
1.0000 "application " | Freq: Every day | CUTANEOUS | Status: DC
  Filled 2016-09-07: qty 30

## 2016-09-07 MED ORDER — GADOBENATE DIMEGLUMINE 529 MG/ML IV SOLN
17.0000 mL | Freq: Once | INTRAVENOUS | Status: AC | PRN
  Administered 2016-09-07: 17 mL via INTRAVENOUS

## 2016-09-07 NOTE — ED Notes (Signed)
Patient transported to Ultrasound 
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
## 2016-09-07 NOTE — ED Notes (Signed)
Patient transported to MRI 

## 2016-09-07 NOTE — ED Notes (Signed)
Report attempted 
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
## 2016-09-08 DIAGNOSIS — E119 Type 2 diabetes mellitus without complications: Secondary | ICD-10-CM

## 2016-09-08 DIAGNOSIS — I10 Essential (primary) hypertension: Secondary | ICD-10-CM

## 2016-09-08 DIAGNOSIS — G8929 Other chronic pain: Secondary | ICD-10-CM

## 2016-09-08 DIAGNOSIS — R739 Hyperglycemia, unspecified: Secondary | ICD-10-CM

## 2016-09-08 DIAGNOSIS — F418 Other specified anxiety disorders: Secondary | ICD-10-CM

## 2016-09-08 DIAGNOSIS — K852 Alcohol induced acute pancreatitis without necrosis or infection: Principal | ICD-10-CM

## 2016-09-08 DIAGNOSIS — E785 Hyperlipidemia, unspecified: Secondary | ICD-10-CM

## 2016-09-08 LAB — COMPREHENSIVE METABOLIC PANEL
ALBUMIN: 3.6 g/dL (ref 3.5–5.0)
ALT: 14 U/L — ABNORMAL LOW (ref 17–63)
ANION GAP: 10 (ref 5–15)
AST: 15 U/L (ref 15–41)
Alkaline Phosphatase: 101 U/L (ref 38–126)
BILIRUBIN TOTAL: 0.9 mg/dL (ref 0.3–1.2)
BUN: 7 mg/dL (ref 6–20)
CHLORIDE: 101 mmol/L (ref 101–111)
CO2: 25 mmol/L (ref 22–32)
Calcium: 8.8 mg/dL — ABNORMAL LOW (ref 8.9–10.3)
Creatinine, Ser: 0.71 mg/dL (ref 0.61–1.24)
GFR calc Af Amer: >60 mL/min (ref >60)
GFR calc non Af Amer: >60 mL/min (ref >60)
GLUCOSE: 225 mg/dL — AB (ref 65–99)
POTASSIUM: 4.1 mmol/L (ref 3.5–5.1)
SODIUM: 136 mmol/L (ref 135–145)
TOTAL PROTEIN: 6.6 g/dL (ref 6.5–8.1)

## 2016-09-08 LAB — HEMOGLOBIN A1C
HEMOGLOBIN A1C: 12.7 % — AB (ref 4.8–5.6)
MEAN PLASMA GLUCOSE: 318 mg/dL

## 2016-09-08 LAB — LIPID PANEL
CHOL/HDL RATIO: 5.4 ratio
Cholesterol: 124 mg/dL (ref 0–200)
HDL: 23 mg/dL — AB (ref >40)
LDL Cholesterol: 58 mg/dL (ref 0–99)
Triglycerides: 216 mg/dL — ABNORMAL HIGH (ref <150)
VLDL: 43 mg/dL — AB (ref 0–40)

## 2016-09-08 LAB — GLUCOSE, CAPILLARY
GLUCOSE-CAPILLARY: 235 mg/dL — AB (ref 65–99)
GLUCOSE-CAPILLARY: 268 mg/dL — AB (ref 65–99)
GLUCOSE-CAPILLARY: 237 mg/dL — AB (ref 65–99)
Glucose-Capillary: 263 mg/dL — ABNORMAL HIGH (ref 65–99)

## 2016-09-08 LAB — CBC
HEMATOCRIT: 44.6 % (ref 39.0–52.0)
Hemoglobin: 15.8 g/dL (ref 13.0–17.0)
MCH: 32 pg (ref 26.0–34.0)
MCHC: 35.4 g/dL (ref 30.0–36.0)
MCV: 90.3 fL (ref 78.0–100.0)
PLATELETS: 143 10*3/uL — AB (ref 150–400)
RBC: 4.94 MIL/uL (ref 4.22–5.81)
RDW: 13 % (ref 11.5–15.5)
WBC: 6.8 10*3/uL (ref 4.0–10.5)

## 2016-09-08 LAB — HIV ANTIBODY (ROUTINE TESTING W REFLEX): HIV SCREEN 4TH GENERATION: NONREACTIVE

## 2016-09-08 MED ORDER — LORAZEPAM 1 MG PO TABS: 1.0000 mg | ORAL_TABLET | Freq: Four times a day (QID) | ORAL | Status: DC | PRN

## 2016-09-08 MED ORDER — LORAZEPAM 1 MG PO TABS: 0.0000 mg | ORAL_TABLET | Freq: Two times a day (BID) | ORAL | Status: DC

## 2016-09-08 MED ORDER — ADULT MULTIVITAMIN W/MINERALS CH
1.0000 | ORAL_TABLET | Freq: Every day | ORAL | Status: DC
  Administered 2016-09-08 – 2016-09-09 (×2): 1 via ORAL
  Filled 2016-09-08 (×2): qty 1

## 2016-09-08 MED ORDER — LORAZEPAM 1 MG PO TABS
0.0000 mg | ORAL_TABLET | Freq: Four times a day (QID) | ORAL | Status: DC
  Administered 2016-09-08: 2 mg via ORAL
  Filled 2016-09-08: qty 2

## 2016-09-08 MED ORDER — LORAZEPAM 2 MG/ML IJ SOLN: 1.0000 mg | Freq: Four times a day (QID) | INTRAMUSCULAR | Status: DC | PRN

## 2016-09-08 MED ORDER — INSULIN GLARGINE 100 UNIT/ML ~~LOC~~ SOLN
10.0000 [IU] | Freq: Every day | SUBCUTANEOUS | Status: DC
  Administered 2016-09-08: 10 [IU] via SUBCUTANEOUS
  Filled 2016-09-08 (×2): qty 0.1

## 2016-09-08 NOTE — Progress Notes (Signed)
Inpatient Diabetes Program Recommendations  AACE/ADA: New Consensus Statement on Inpatient Glycemic Control (2015)  Target Ranges:  Prepandial:   less than 140 mg/dL      Peak postprandial:   less than 180 mg/dL (1-2 hours)      Critically ill patients:  140 - 180 mg/dL  Results for CAM, DAUPHIN (MRN [id number]) as of 09/08/2016 09:16  Ref. Range 09/07/2016 14:18 09/07/2016 16:58 09/07/2016 17:49 09/07/2016 22:33 09/08/2016 07:23  Glucose-Capillary Latest Ref Range: 65 - 99 mg/dL 261 (H) 268 (H) 271 (H) 171 (H) 235 (H)   Results for PAXON, PROPES (MRN [id number]) as of 09/08/2016 09:16  Ref. Range 09/07/2016 14:01 09/08/2016 03:54  Glucose Latest Ref Range: 65 - 99 mg/dL  225 (H)  Hemoglobin A1C Latest Ref Range: 4.8 - 5.6 % 12.7 (H)    Review of Glycemic Control  Diabetes history: No Outpatient Diabetes medications: NA Current orders for Inpatient glycemic control: Lantus 10 units QHS, Novolog 0-9 units TID with meals, Novolog 0-5 units QHS  Inpatient Diabetes Program Recommendations: HgbA1C: A1C 12.7% on 09/07/16 indicating an average glucose of 318 mg/dl over the past 2-3 months. MD, please make indicate in progress note if patient will be diagnosed with DM at this time. If so, please inform patient and nursing staff so that patient can be educated.   NOTE: Patient admitted with pancreatitis and per H&P "Glucose 395. No history of diabetes. Likely secondary to pancreatic dysfunction in the setting of acute pancreatitis."  If patient is dx with DM at this time bedside nursing will need to educate patient on DM and insulin (in case patient is discharged on insulin).  Thanks, Barnie Alderman, RN, MSN, CDE Diabetes Coordinator Inpatient Diabetes Program [phone] (Team Pager from 8am to 5pm)

## 2016-09-08 NOTE — Progress Notes (Addendum)
1140 Pt was diaphoretic, hand tremors noted. V/S checked, w/ in normal range. Dr Darrick Meigs notified. Pt drinks 2 bot of beer everyday. CIWA initiated, Ativan given.   1700 Pt feeling a lot better, no sweating, hand tremors not visible.

## 2016-09-08 NOTE — Progress Notes (Addendum)
[facility]  PROGRESS NOTE  Curtis Kim BCW:[id number] DOB: [birth year] DOA: 09/07/2016 PCP: Vonna Drafts, FNP   Brief HPI:   61 y.o. male with medical history significant of GERD, hyperlipidemia, hypertension, tobacco abuse. Patient presenting with 3 day history of abdominal pain. Epigastric in location without radiation. Constant and worse after meals. Sharp and very intense as described by the patient. Patient has never had pain like this before. States that he doesn't typically drink alcohol but had alcohol drink this to these on [place]. Denies chest pain, shortness breath, palpitations, dysuria, frequency, diarrhea, neck stiffness, headache, focal neurological deficit. Symptoms are getting worse per patient.     Subjective   Patient had episode of profuse sweating this morning. Patient says that he has been getting episodes at home. He does drink alcohol. Blood glucose was elevated at  admission. Hemoglobin A1c is 12.7.   Assessment/Plan:     1. New onset diabetes mellitus- patient's hemoglobin A1c is significantly elevated at 12.7, will start Lantus 10 units subcutaneous daily. Sliding scale insulin with NovoLog. Patient will be discharged on insulin at the time of discharge. 2. Acute appendicitis- improving, will start on clear liquid diet. Lipase was elevated to 276. Will repeat lipase in a.m. 3. Alcohol withdrawal-patient having profuse sweating episodes in the hospital. Admits drinking alcohol at home. We'll start CIWA protocol. 4. Pseudohyponatremia-sodium was 127 likely from hyperglycemia at the time of admission, has improved to 136. 5. Depression/anxiety-continue Zoloft, Abilify, Klonopin 6. Hypertension-blood pressure stable, continue lisinopril, HCTZ. 7. Renal mass- seen on MRCP, called and discussed with urology Dr. Wendy Poet, who recommends outpatient follow-up with Dr. Tresa Moore.    DVT prophylaxis: Heparin  Code Status: Full code  Family  Communication: No family present at bedside   Disposition Plan: Likely home in 1-2 days   Consultants:  None  Procedures:  None  Continuous infusions . sodium chloride 100 mL/hr at 09/08/16 1146  . methocarbamol (ROBAXIN)  IV        Antibiotics:   Anti-infectives    None       Objective   Vitals:   09/07/16 2115 09/08/16 0540 09/08/16 0753 09/08/16 1310  BP: 127/75 119/78 123/89 123/82  Pulse: 79 99 100 84  Resp:    18  Temp: 98.2 F (36.8 C) 98.5 F (36.9 C) 98 F (36.7 C) 98.4 F (36.9 C)  TempSrc: Oral Oral Oral Oral  SpO2: 100% 97% 96% 95%  Weight:      Height:        Intake/Output Summary (Last 24 hours) at 09/08/16 1626 Last data filed at 09/08/16 1000  Gross per 24 hour  Intake             2010 ml  Output              960 ml  Net             1050 ml   Filed Weights   09/06/16 2238 09/07/16 1405  Weight: 79.4 kg (175 lb) 81.6 kg (180 lb)     Physical Examination:   Physical Exam: Eyes: No icterus, extraocular muscles intact  Mouth: Oral mucosa is moist, no lesions on palate,  Neck: Supple, no deformities, masses, or tenderness Lungs: Normal respiratory effort, bilateral clear to auscultation, no crackles or wheezes.  Heart: Regular rate and rhythm, S1 and S2 normal, no murmurs, rubs auscultated Abdomen: BS normoactive,soft,nondistended,non-tender to palpation,no organomegaly Extremities: No pretibial edema, no erythema, no cyanosis, no clubbing Neuro :  Alert and oriented to time, place and person, No focal deficits  Skin: No rashes seen on exam     Data Reviewed: I have personally reviewed following labs and imaging studies  CBG:  Recent Labs Lab 09/07/16 1658 09/07/16 1749 09/07/16 2233 09/08/16 0723 09/08/16 1312  GLUCAP 268* 271* 171* 235* 268*    CBC:  Recent Labs Lab 09/06/16 2245 09/08/16 0354  WBC 10.4 6.8  HGB 15.7 15.8  HCT 44.1 44.6  MCV 88.6 90.3  PLT 182 143*    Basic Metabolic Panel:  Recent  Labs Lab 09/06/16 2245 09/08/16 0354  NA 127* 136  K 4.3 4.1  CL 91* 101  CO2 27 25  GLUCOSE 395* 225*  BUN 11 7  CREATININE 0.78 0.71  CALCIUM 9.5 8.8*    No results found for this or any previous visit (from the past 240 hour(s)).   Liver Function Tests:  Recent Labs Lab 09/06/16 2245 09/08/16 0354  AST 16 15  ALT 15* 14*  ALKPHOS 121 101  BILITOT 0.5 0.9  PROT 6.9 6.6  ALBUMIN 4.1 3.6    Recent Labs Lab 09/06/16 2245  LIPASE 276*   No results for input(s): AMMONIA in the last 168 hours.  Cardiac Enzymes: No results for input(s): CKTOTAL, CKMB, CKMBINDEX, TROPONINI in the last 168 hours. BNP (last 3 results) No results for input(s): BNP in the last 8760 hours.  ProBNP (last 3 results) No results for input(s): PROBNP in the last 8760 hours.    Studies: US Abdomen Limited  Result Date: 09/07/2016 CLINICAL DATA:  Right upper quadrant abdominal pain EXAM: ULTRASOUND ABDOMEN LIMITED RIGHT UPPER QUADRANT COMPARISON:  None. FINDINGS: Gallbladder: Numerous layering gallstones. No wall thickening or focal tenderness to suggest acute cholecystitis. Common bile duct: Diameter: 6 mm.  Where visualized, no filling defect. Liver: Heterogeneous and echogenic liver with geographic hypoechoic area along the central fissures consistent with sparing. Antegrade flow in the main portal vein. No discrete mass lesion. IMPRESSION: 1. Cholelithiasis without evidence of acute cholecystitis. 2. Heterogeneous liver in the setting of steatosis. Hypoechoic area along the central fissures is best attributed to fatty sparing Electronically Signed   By: Monte Fantasia M.D.   On: 09/07/2016 08:38   Mr Abdomen Mrcp Moise Boring Contast  Result Date: 09/07/2016 CLINICAL DATA:  Several day history of abdominal pain and bloating. Gallstones noted on recent ultrasound. EXAM: MRI ABDOMEN WITHOUT AND WITH CONTRAST (INCLUDING MRCP) TECHNIQUE: Multiplanar multisequence MR imaging of the abdomen was performed  both before and after the administration of intravenous contrast. Heavily T2-weighted images of the biliary and pancreatic ducts were obtained, and three-dimensional MRCP images were rendered by post processing. CONTRAST:  86mL MULTIHANCE GADOBENATE DIMEGLUMINE 529 MG/ML IV SOLN COMPARISON:  Right upper quadrant ultrasound 09/07/2016 FINDINGS: Lower chest: The lung bases are clear. No pulmonary lesions or pleural effusion. No pericardial effusion. Hepatobiliary: No focal hepatic lesions or intrahepatic biliary dilatation. The portal and hepatic veins are normal. Multiple small gallstones are noted the gallbladder. No MR findings to suggest acute cholecystitis. Normal caliber common bile duct. No common bile duct stones are identified. Pancreas: Slightly enlarged and inflamed appearing pancreatic head widths mild peripancreatic inflammatory change suggesting pancreatitis. No findings for pancreatic necrosis. No pancreatic mass or ductal dilatation. Spleen: Splenomegaly. The spleen measures 16 x 12 x 7.3 Cm. Small lower pole lesion is likely a benign cyst or hemangioma. Adrenals/Urinary Tract: Small adrenal gland nodule consistent with benign adenoma. The right adrenal gland is normal. The  right kidney is normal. There is a 4.6 x 4.8 x 4.3 cm upper pole left renal lesion medially. The lesion is fairly well-circumscribed and possibly encapsulated. Delayed enhancement pattern with a somewhat stellate appearance on the most delayed images could reflect an oncocytoma. Renal cell carcinoma and needs to be excluded however. Single renal arteries bilaterally. Small scattered retroperitoneal lymph nodes but no mass or overt lymphadenopathy. Stomach/Bowel: Visualized portions within the abdomen are unremarkable. Vascular/Lymphatic: The aorta and branch vessels are patent. The major venous structures appear normal. Small scattered retroperitoneal lymph nodes. No mass or overt adenopathy. Other:  No ascites or abdominal wall  hernia. Musculoskeletal: No significant bony findings. IMPRESSION: 1. Suspect mild changes of pancreatitis involving the pancreatic head but no complicating features such as pancreatic necrosis. 2. Cholelithiasis without definite findings for acute cholecystitis. No biliary dilatation or common bile duct stones. 3. **An incidental finding of potential clinical significance has been found. 4.6 x 4.8 x 4.3 cm left renal mass. Renal cell carcinoma needs to be excluded. However, I think is possible this could be an oncocytoma. Recommend urology consultation. ** 4. Small scattered retroperitoneal lymph nodes but no mass or overt adenopathy. 5. Mild splenomegaly. Electronically Signed   By: Marijo Sanes M.D.   On: 09/07/2016 14:22    Scheduled Meds: . ARIPiprazole  5 mg Oral QHS  . aspirin  81 mg Oral Daily  . famotidine  10 mg Oral Daily  . folic acid  1 mg Oral Daily  . heparin  5,000 Units Subcutaneous Q8H  . lisinopril  10 mg Oral BID   And  . hydrochlorothiazide  12.5 mg Oral BID  . insulin aspart  0-5 Units Subcutaneous QHS  . insulin aspart  0-9 Units Subcutaneous TID WC  . insulin glargine  10 Units Subcutaneous QHS  . LORazepam  0-4 mg Oral Q6H   Followed by  . [START ON 09/10/2016] LORazepam  0-4 mg Oral Q12H  . multivitamin with minerals  1 tablet Oral Daily  . pantoprazole  40 mg Oral Daily  . rosuvastatin  20 mg Oral Daily  . sertraline  100 mg Oral Daily  . thiamine  100 mg Oral Daily      Time spent: 25 min  [facility] Pager [phone]. If 7PM-7AM, please contact night-coverage at [email or web address], Office  [phone]  password [email or web address]  09/08/2016, 4:26 PM  LOS: 1 day

## 2016-09-09 DIAGNOSIS — E871 Hypo-osmolality and hyponatremia: Secondary | ICD-10-CM

## 2016-09-09 DIAGNOSIS — R109 Unspecified abdominal pain: Secondary | ICD-10-CM

## 2016-09-09 LAB — GLUCOSE, CAPILLARY
Glucose-Capillary: 193 mg/dL — ABNORMAL HIGH (ref 65–99)
Glucose-Capillary: 328 mg/dL — ABNORMAL HIGH (ref 65–99)

## 2016-09-09 LAB — LIPASE, BLOOD: LIPASE: 77 U/L — AB (ref 11–51)

## 2016-09-09 MED ORDER — LIVING WELL WITH DIABETES BOOK
Freq: Once | Status: AC
  Administered 2016-09-09: 13:00:00
  Filled 2016-09-09: qty 1

## 2016-09-09 MED ORDER — INSULIN STARTER KIT- PEN NEEDLES (ENGLISH)
1.0000 | Freq: Once | Status: AC
Start: 2016-09-09 — End: 2016-09-09
  Administered 2016-09-09: 1
  Filled 2016-09-09: qty 1

## 2016-09-09 MED ORDER — METFORMIN HCL 500 MG PO TABS: 500.0000 mg | ORAL_TABLET | Freq: Two times a day (BID) | ORAL | 11 refills | Status: AC

## 2016-09-09 MED ORDER — INSULIN GLARGINE 100 UNIT/ML SOLOSTAR PEN: 10.0000 [IU] | PEN_INJECTOR | Freq: Every day | SUBCUTANEOUS | 5 refills | Status: DC

## 2016-09-09 MED ORDER — UNABLE TO FIND: 0 refills | Status: AC

## 2016-09-09 NOTE — Progress Notes (Addendum)
Discharge instructions reviewed with pt and prescriptions given. Also demonstrated to pt with lunchtime insulin how to administer.  Pt verbalized understanding and had no questions regarding insulin administration or discharge instructions.  Pt also verbalized that he felt comfortable checking his blood sugar and administering insulin.  Pt discharged in stable condition via wheelchair with sister.  Eliezer [place]

## 2016-09-09 NOTE — Discharge Summary (Signed)
Physician Discharge Summary  Curtis Kim JSE:[id number] DOB: 10-24-1955 DOA: 09/07/2016  PCP: Vonna Drafts, FNP  Admit date: 09/07/2016 Discharge date: 09/09/2016  Time spent: 45* minutes  Recommendations for Outpatient Follow-up:  Follow up PCP in 2 weeks  Discharge Diagnoses:  Active Problems:   Pancreatitis   Hyperglycemia   Pancreatic insufficiency   Intractable abdominal pain   Depression with anxiety   Hyponatremia   Essential hypertension   Chronic pain   HLD (hyperlipidemia)   Type 2 diabetes mellitus without complication [facility])   Discharge Condition: Stable  Diet recommendation: Carb modified diet  Filed Weights   09/06/16 2238 09/07/16 1405  Weight: 79.4 kg (175 lb) 81.6 kg (180 lb)    History of present illness:  61 y.o.malewith medical history significant of GERD, hyperlipidemia, hypertension, tobacco abuse. Patient presenting with 3 day history of abdominal pain. Epigastric in location without radiation. Constant and worse after meals. Sharp and very intense as described by the patient. Patient has never had pain like this before. States that he doesn't typically drink alcohol but had alcohol drink this to these on [place]. Denies chest pain, shortness breath, palpitations, dysuria, frequency, diarrhea, neck stiffness, headache, focal neurological deficit. Symptoms are getting worse per patient  Hospital Course:  1. New onset diabetes mellitus- patient's hemoglobin A1c is significantly elevated at 12.7, will start Lantus 10 units subcutaneous daily.Will also start Metformin 500 mg po bid. 2. Acute pancreatitis- resolved, repeat Lipase this am is 77. Toleared diet well. 3. Alcohol withdrawal - resolved, no more symptoms of alcohol withdrawal-patient  Was started on CIWA protocol. 4. Pseudohyponatremia-sodium was 127 likely from hyperglycemia at the time of admission, has improved to 136. 5. Depression/anxiety-continue Zoloft, Abilify,  Klonopin 6. Hypertension-blood pressure stable, continue lisinopril, HCTZ. 7. Renal mass- seen on MRCP, called and discussed with urology Dr. Wendy Poet, who recommends outpatient follow-up with Dr. Tresa Moore.   Procedures:  None   Consultations:  None   Discharge Exam: Vitals:   09/09/16 0513 09/09/16 1227  BP: 125/78 126/84  Pulse: 88 91  Resp: 18 18  Temp: 97.8 F (36.6 C) 97.7 F (36.5 C)    General: Appears in no acute distress Cardiovascular: S1s2 RRR Respiratory: Clear bilaterally Abdomen- soft, nontender, no organomegaly  Discharge Instructions   Discharge Instructions    Ambulatory referral to Nutrition and Diabetic Education    Complete by:  As directed    New DM dx; A1C 12.7% on 09/07/16. Will be discharging on insulin pens.   Diet - low sodium heart healthy    Complete by:  As directed    Increase activity slowly    Complete by:  As directed      Current Discharge Medication List    START taking these medications   Details  Insulin Glargine (LANTUS) 100 UNIT/ML Solostar Pen Inject 10 Units into the skin daily at 10 pm. Qty: 3 mL, Refills: 5    UNABLE TO FIND Glucometer #1 Insulin pen needles # 1 box Glucose test strips #1 box Lancets #1 box Qty: 1 each, Refills: 0      CONTINUE these medications which have NOT CHANGED   Details  albuterol (PROVENTIL HFA;VENTOLIN HFA) 108 (90 Base) MCG/ACT inhaler Inhale 2 puffs into the lungs every 4 (four) hours as needed for wheezing or shortness of breath.    ARIPiprazole (ABILIFY) 5 MG tablet Take 5 mg by mouth at bedtime.    aspirin 81 MG chewable tablet Chew 81 mg by mouth daily.  baclofen (LIORESAL) 10 MG tablet Take 10 mg by mouth 2 (two) times daily as needed (for headaces).     clonazePAM (KLONOPIN) 1 MG tablet Take 1 mg by mouth 2 (two) times daily as needed for anxiety.    cyclobenzaprine (FLEXERIL) 10 MG tablet Take 10 mg by mouth 2 (two) times daily as needed for muscle spasms. Refills: 1     diltiazem 2 % GEL Apply 1 application topically 3 (three) times daily. Qty: 30 g, Refills: 2    docusate sodium (COLACE) 100 MG capsule Take 1 capsule (100 mg total) by mouth every 12 (twelve) hours. Qty: 60 capsule, Refills: 0    hydrOXYzine (ATARAX/VISTARIL) 25 MG tablet Take 25 mg by mouth every 6 (six) hours as needed for itching.    imipramine (TOFRANIL) 25 MG tablet Take 50 mg by mouth daily as needed (for headaches).     lisinopril-hydrochlorothiazide (PRINZIDE,ZESTORETIC) 10-12.5 MG per tablet Take 1 tablet by mouth 2 (two) times daily.    loratadine (CLARITIN) 10 MG tablet Take 10 mg by mouth daily.    omeprazole (PRILOSEC) 20 MG capsule Take 20 mg by mouth daily.    ranitidine (ZANTAC) 300 MG tablet Take 300 mg by mouth at bedtime.     rosuvastatin (CRESTOR) 20 MG tablet Take 20 mg by mouth daily.    sertraline (ZOLOFT) 100 MG tablet Take 100 mg by mouth daily.    traMADol (ULTRAM) 50 MG tablet Take 50 mg by mouth daily as needed for pain. Refills: 0    triamcinolone (KENALOG) 0.025 % ointment Apply 1 application topically 2 (two) times daily as needed. To chest and back Refills: 0      STOP taking these medications     hydrocortisone (ANUSOL-HC) 25 MG suppository        No Known Allergies Follow-up Information    Alexis Frock, MD. Schedule an appointment as soon as possible for a visit in 2 week(s).   Specialty:  Urology Contact information: [address] [place] [address] [phone]            The results of significant diagnostics from this hospitalization (including imaging, microbiology, ancillary and laboratory) are listed below for reference.    Significant Diagnostic Studies: US Abdomen Limited  Result Date: 09/07/2016 CLINICAL DATA:  Right upper quadrant abdominal pain EXAM: ULTRASOUND ABDOMEN LIMITED RIGHT UPPER QUADRANT COMPARISON:  None. FINDINGS: Gallbladder: Numerous layering gallstones. No wall thickening or focal tenderness  to suggest acute cholecystitis. Common bile duct: Diameter: 6 mm.  Where visualized, no filling defect. Liver: Heterogeneous and echogenic liver with geographic hypoechoic area along the central fissures consistent with sparing. Antegrade flow in the main portal vein. No discrete mass lesion. IMPRESSION: 1. Cholelithiasis without evidence of acute cholecystitis. 2. Heterogeneous liver in the setting of steatosis. Hypoechoic area along the central fissures is best attributed to fatty sparing Electronically Signed   By: Monte Fantasia M.D.   On: 09/07/2016 08:38   Mr Abdomen Mrcp Moise Boring Contast  Result Date: 09/07/2016 CLINICAL DATA:  Several day history of abdominal pain and bloating. Gallstones noted on recent ultrasound. EXAM: MRI ABDOMEN WITHOUT AND WITH CONTRAST (INCLUDING MRCP) TECHNIQUE: Multiplanar multisequence MR imaging of the abdomen was performed both before and after the administration of intravenous contrast. Heavily T2-weighted images of the biliary and pancreatic ducts were obtained, and three-dimensional MRCP images were rendered by post processing. CONTRAST:  53mL MULTIHANCE GADOBENATE DIMEGLUMINE 529 MG/ML IV SOLN COMPARISON:  Right upper quadrant ultrasound 09/07/2016  FINDINGS: Lower chest: The lung bases are clear. No pulmonary lesions or pleural effusion. No pericardial effusion. Hepatobiliary: No focal hepatic lesions or intrahepatic biliary dilatation. The portal and hepatic veins are normal. Multiple small gallstones are noted the gallbladder. No MR findings to suggest acute cholecystitis. Normal caliber common bile duct. No common bile duct stones are identified. Pancreas: Slightly enlarged and inflamed appearing pancreatic head widths mild peripancreatic inflammatory change suggesting pancreatitis. No findings for pancreatic necrosis. No pancreatic mass or ductal dilatation. Spleen: Splenomegaly. The spleen measures 16 x 12 x 7.3 Cm. Small lower pole lesion is likely a benign cyst or  hemangioma. Adrenals/Urinary Tract: Small adrenal gland nodule consistent with benign adenoma. The right adrenal gland is normal. The right kidney is normal. There is a 4.6 x 4.8 x 4.3 cm upper pole left renal lesion medially. The lesion is fairly well-circumscribed and possibly encapsulated. Delayed enhancement pattern with a somewhat stellate appearance on the most delayed images could reflect an oncocytoma. Renal cell carcinoma and needs to be excluded however. Single renal arteries bilaterally. Small scattered retroperitoneal lymph nodes but no mass or overt lymphadenopathy. Stomach/Bowel: Visualized portions within the abdomen are unremarkable. Vascular/Lymphatic: The aorta and branch vessels are patent. The major venous structures appear normal. Small scattered retroperitoneal lymph nodes. No mass or overt adenopathy. Other:  No ascites or abdominal wall hernia. Musculoskeletal: No significant bony findings. IMPRESSION: 1. Suspect mild changes of pancreatitis involving the pancreatic head but no complicating features such as pancreatic necrosis. 2. Cholelithiasis without definite findings for acute cholecystitis. No biliary dilatation or common bile duct stones. 3. **An incidental finding of potential clinical significance has been found. 4.6 x 4.8 x 4.3 cm left renal mass. Renal cell carcinoma needs to be excluded. However, I think is possible this could be an oncocytoma. Recommend urology consultation. ** 4. Small scattered retroperitoneal lymph nodes but no mass or overt adenopathy. 5. Mild splenomegaly. Electronically Signed   By: Marijo Sanes M.D.   On: 09/07/2016 14:22    Microbiology: No results found for this or any previous visit (from the past 240 hour(s)).   Labs: Basic Metabolic Panel:  Recent Labs Lab 09/06/16 2245 09/08/16 0354  NA 127* 136  K 4.3 4.1  CL 91* 101  CO2 27 25  GLUCOSE 395* 225*  BUN 11 7  CREATININE 0.78 0.71  CALCIUM 9.5 8.8*   Liver Function  Tests:  Recent Labs Lab 09/06/16 2245 09/08/16 0354  AST 16 15  ALT 15* 14*  ALKPHOS 121 101  BILITOT 0.5 0.9  PROT 6.9 6.6  ALBUMIN 4.1 3.6    Recent Labs Lab 09/06/16 2245 09/09/16 0446  LIPASE 276* 77*   No results for input(s): AMMONIA in the last 168 hours. CBC:  Recent Labs Lab 09/06/16 2245 09/08/16 0354  WBC 10.4 6.8  HGB 15.7 15.8  HCT 44.1 44.6  MCV 88.6 90.3  PLT 182 143*   Cardiac Enzymes: No results for input(s): CKTOTAL, CKMB, CKMBINDEX, TROPONINI in the last 168 hours. BNP: BNP (last 3 results) No results for input(s): BNP in the last 8760 hours.  ProBNP (last 3 results) No results for input(s): PROBNP in the last 8760 hours.  CBG:  Recent Labs Lab 09/08/16 1312 09/08/16 1700 09/08/16 2123 09/09/16 0803 09/09/16 1225  GLUCAP 268* 263* 237* 193* 328*       Signed:  Oswald Hillock MD.  [facility] 09/09/2016, 3:15 PM

## 2016-09-09 NOTE — Progress Notes (Signed)
Inpatient Diabetes Program Recommendations  AACE/ADA: New Consensus Statement on Inpatient Glycemic Control (2015)  Target Ranges:  Prepandial:   less than 140 mg/dL      Peak postprandial:   less than 180 mg/dL (1-2 hours)      Critically ill patients:  140 - 180 mg/dL   Results for Curtis Kim, Curtis Kim (MRN [id number]) as of 09/09/2016 14:16  Ref. Range 09/08/2016 07:23 09/08/2016 13:12 09/08/2016 17:00 09/08/2016 21:23 09/09/2016 08:03 09/09/2016 12:25  Glucose-Capillary Latest Ref Range: 65 - 99 mg/dL 235 (H) 268 (H) 263 (H) 237 (H) 193 (H) 328 (H)   Review of Glycemic Control  Diabetes history: No Outpatient Diabetes medications: NA Current orders for Inpatient glycemic control: Lantus 10 units QHS, Novolog 0-9 units TID with meals, Novolog 0-5 units QHS  Inpatient Diabetes Program Recommendations:  Insulin - Basal: Please consider increasing Lantus to 12 units QHS. Insulin - Meal Coverage: Please consider ordering Novolog 4 units TID with meals for meal coverage if patient eats at least 50% of meals. HgbA1C: A1C 12.7% on 09/07/16 indicating an average glucose of 318 mg/dl over the past 2-3 months.  Outpatient DM plan: Patient prefers to use insulin pens as an outpatient. If discharged on insulin please provide Rx for insulin pen(s), insulin pen needles, glucometer, and testing supplies. Recommend patient follow up with PCP as soon as possible.  NOTE: Spoke with patient about new diabetes diagnosis. Patient reports that his mother had diabetes and took pills and insulin for DM control. Patient states that he has never been told he had DM or pre-DM in the past. Discussed A1C results (12.7% on 09/07/16) and explained what an A1C is and informed patient that his current A1C indicates an average glucose of 318 mg/dl over the past 2-3 months. Discussed basic pathophysiology of DM Type 2, basic home care, importance of checking CBGs and maintaining good CBG control to prevent long-term and short-term  complications. Reviewed glucose and A1C goals.  Reviewed signs and symptoms of hyperglycemia and hypoglycemia along with treatment for both. Discussed impact of nutrition, exercise, stress, sickness, and medications on diabetes control. Reviewed Living Well with diabetes booklet and encouraged patient to read through entire book.  Discussed Lantus and Novolog insulin and how they work to keep DM controlled.  Asked patient to check his glucose 4 times per day (before meals and at bedtime) and to keep a log book of glucose readings and insulin taken. Explained how the doctor he follows up with can use the log book to continue to make insulin adjustments if needed. Reviewed and demonstrated how to draw up and administer insulin with vial and syringe and with an insulin pen. Patient states that he prefers to use insulin pens as an outpatient.  Educated patient on insulin pen use at home. Reviewed contents of insulin flexpen starter kit. Reviewed all steps of insulin pen including attachment of needle, 2-unit air shot, dialing up dose, giving injection, removing needle, disposal of sharps, storage of unused insulin, disposal of insulin etc. Patient able to provide successful return demonstration. Informed patient that RN will be asking him to self-administer insulin to ensure proper technique and ability to administer self insulin shots.   Patient verbalized understanding of information discussed and he states that he has no further questions at this time related to diabetes.   RNs to provide ongoing basic DM education at bedside with this patient and engage patient to actively check blood glucose and administer insulin injections. Placed consult for CM to determine  which insulins are covered under Medicare/Medicaid.   Thanks, Barnie Alderman, RN, MSN, CDE Diabetes Coordinator Inpatient Diabetes Program [phone] (Team Pager from 8am to 5pm)

## 2016-09-29 ENCOUNTER — Encounter: Payer: Medicare Other | Attending: Nurse Practitioner | Admitting: Registered"

## 2016-09-29 ENCOUNTER — Encounter: Payer: Self-pay | Admitting: Registered"

## 2016-09-29 DIAGNOSIS — Z713 Dietary counseling and surveillance: Secondary | ICD-10-CM | POA: Insufficient documentation

## 2016-09-29 DIAGNOSIS — E119 Type 2 diabetes mellitus without complications: Secondary | ICD-10-CM | POA: Diagnosis not present

## 2016-09-29 NOTE — Progress Notes (Signed)
Diabetes Self-Management Education  Visit Type: First/Initial  Appt. Start Time: 1410 Appt. End Time: 1510  09/29/2016  Mr. Curtis Kim, identified by name and date of birth, is a 61 y.o. male with a diagnosis of Diabetes: Type 2.   ASSESSMENT Pt states he lives with sister who also has also diabetes. Pt states he didn't know he had diabetes until he went to the hospital with stomach pain (alcohol induced pancreatitis, per chart)   Pt states over about 6 months he has lost a lot of weight and doesn't know why. RD discussed the diabetes may have been partly responsible. Pt states he also has less of an appetite and we discussed appropriate nutritional supplements.     Diabetes Self-Management Education - 09/29/16 1421      Visit Information   Visit Type First/Initial     Initial Visit   Diabetes Type Type 2   Are you currently following a meal plan? No   Are you taking your medications as prescribed? Yes  Insulin 10 units at night, metformin 500 mg 2x day   Date Diagnosed 09/07/16 per chart     Health Coping   How would you rate your overall health? Fair     Psychosocial Assessment   Patient Belief/Attitude about Diabetes Afraid   Special Needs Simplified materials   How often do you need to have someone help you when you read instructions, pamphlets, or other written materials from your doctor or pharmacy? 5 - Always   What is the last grade level you completed in school? --  low literacy, did not press for this question due to RD percieved pt stress     Complications   Last HgB A1C per patient/outside source 12.7 %  09/07/16 new diagnosis   How often do you check your blood sugar? 1-2 times/day   Fasting Blood glucose range (mg/dL) --  poor historian and pt didn't bring meter   Have you had a dilated eye exam in the past 12 months? No   Have you had a dental exam in the past 12 months? No   Are you checking your feet? No     Dietary Intake   Breakfast ceareal, OJ    Snack (morning) no-sugar added ice cream   Lunch Low fat sandwich, salads, fruit   Dinner baked chicken, fish, hushpuppies, french fries, cucumbers, salad   Snack (evening) ice cream   Beverage(s) water, OJ, sweet tea, mtn dew 1x per week     Exercise   Exercise Type Light (walking / raking leaves)   How many days per week to you exercise? 7   How many minutes per day do you exercise? 20   Total minutes per week of exercise 140     Patient Education   Previous Diabetes Education No   Nutrition management  Role of diet in the treatment of diabetes and the relationship between the three main macronutrients and blood glucose level     Individualized Goals (developed by patient)   Nutrition General guidelines for healthy choices and portions discussed   Physical Activity Exercise 5-7 days per week   Monitoring  Other (comment)  take glucose meter to doctors appointments     Outcomes   Expected Outcomes Demonstrated interest in learning. Expect positive outcomes   Future DMSE PRN   Program Status Completed    Individualized Plan for Diabetes Self-Management Training:   Learning Objective:  Patient will have a greater understanding of diabetes self-management. Patient education  plan is to attend individual and/or group sessions per assessed needs and concerns.  Patient Instructions   When you eat fruit also eat some protein like peanut butter, cheese, or nuts  Try saving the ice cream to eat with meals instead of by itself.  Think about getting some Boost Glucose Control or other protein drinks that are made for good blood sugar control. Carnation 0 sugar Instant Breakfast.  Keep up the walking.  Keep taking your medication regularly.  Cut back on the sweet tea and juice  Take your glucose meter to your doctors appointments.  Expected Outcomes:  Demonstrated interest in learning. Expect positive outcomes  Education material provided: Planning Healthy Meals foldout  education (novo nordisk)  If problems or questions, patient to contact team via:  Phone  Future DSME appointment: PRN

## 2016-09-29 NOTE — Patient Instructions (Addendum)
   When you eat fruit also eat some protein like peanut butter, cheese, or nuts  Try saving the ice cream to eat with meals instead of by itself.  Think about getting some Boost Glucose Control or other protein drinks that are made for good blood sugar control. Carnation 0 sugar Instant Breakfast.  Keep up the walking.  Keep taking your medication regularly.  Cut back on the sweet tea and juice  Take your glucose meter to your doctors appointments.

## 2016-10-21 ENCOUNTER — Other Ambulatory Visit: Payer: Self-pay | Admitting: Urology

## 2016-11-10 ENCOUNTER — Encounter (HOSPITAL_COMMUNITY): Payer: Self-pay

## 2016-11-10 NOTE — Patient Instructions (Addendum)
Dustan Hyams  11/10/2016   Your procedure is scheduled on: 11/12/16  Report to [facility] Main  Entrance Take [place]  elevators to 3rd floor to  [place] at     [place] AM.    Call this number if you have problems the morning of surgery [phone]    Remember: ONLY 1 PERSON MAY GO WITH YOU TO SHORT STAY TO GET  READY MORNING OF [place].             FOLLOW A CLEAR LIQUID DIET THE DAY BEFORE SURGERY AND DRINK ONE BOTTLE OF MAGNESIUM CITRATE BY NOON THE DAY BEFORE SURGERY   Do not drink liquids :After Midnight.     Take these medicines the morning of surgery with A SIP OF WATER: omeprazole, Klonopin if needed, loratadine, inhaler  And bring  DO NOT TAKE ANY DIABETIC MEDICATIONS DAY OF YOUR SURGERY                               You may not have any metal on your body including hair pins and              piercings  Do not wear jewelry,  lotions, powders or perfumes, deodorant                     Men may shave face and neck.   Do not bring valuables to the hospital. [place].  Contacts, dentures or bridgework may not be worn into surgery.  Leave suitcase in the car. After surgery it may be brought to your room.     Patients discharged the day of surgery will not be allowed to drive home.  Name and phone number of your driver:  Special Instructions: N/A              Please read over the following fact sheets you were given: _____________________________________________________________________            [facility] - Preparing for Surgery Before surgery, you can play an important role.  Because skin is not sterile, your skin needs to be as free of germs as possible.  You can reduce the number of germs on your skin by washing with CHG (chlorahexidine gluconate) soap before surgery.  CHG is an antiseptic cleaner which kills germs and bonds with the skin to continue killing germs even after  washing. Please DO NOT use if you have an allergy to CHG or antibacterial soaps.  If your skin becomes reddened/irritated stop using the CHG and inform your nurse when you arrive at Short Stay. Do not shave (including legs and underarms) for at least 48 hours prior to the first CHG shower.  You may shave your face/neck. Please follow these instructions carefully:  1.  Shower with CHG Soap the night before surgery and the  morning of Surgery.  2.  If you choose to wash your hair, wash your hair first as usual with your  normal  shampoo.  3.  After you shampoo, rinse your hair and body thoroughly to remove the  shampoo.  4.  Use CHG as you would any other liquid soap.  You can apply chg directly  to the skin and wash                       Gently with a scrungie or clean washcloth.  5.  Apply the CHG Soap to your body ONLY FROM THE NECK DOWN.   Do not use on face/ open                           Wound or open sores. Avoid contact with eyes, ears mouth and genitals (private parts).                       Wash face,  Genitals (private parts) with your normal soap.             6.  Wash thoroughly, paying special attention to the area where your surgery  will be performed.  7.  Thoroughly rinse your body with warm water from the neck down.  8.  DO NOT shower/wash with your normal soap after using and rinsing off  the CHG Soap.                9.  Pat yourself dry with a clean towel.            10.  Wear clean pajamas.            11.  Place clean sheets on your bed the night of your first shower and do not  sleep with pets. Day of Surgery : Do not apply any lotions/deodorants the morning of surgery.  Please wear clean clothes to the hospital/surgery center.  FAILURE TO FOLLOW THESE INSTRUCTIONS MAY RESULT IN THE CANCELLATION OF YOUR SURGERY PATIENT SIGNATURE_________________________________  NURSE  SIGNATURE__________________________________  ________________________________________________________________________  WHAT IS A BLOOD TRANSFUSION? Blood Transfusion Information  A transfusion is the replacement of blood or some of its parts. Blood is made up of multiple cells which provide different functions.  Red blood cells carry oxygen and are used for blood loss replacement.  White blood cells fight against infection.  Platelets control bleeding.  Plasma helps clot blood.  Other blood products are available for specialized needs, such as hemophilia or other clotting disorders. BEFORE THE TRANSFUSION  Who gives blood for transfusions?   Healthy volunteers who are fully evaluated to make sure their blood is safe. This is blood bank blood. Transfusion therapy is the safest it has ever been in the practice of medicine. Before blood is taken from a donor, a complete history is taken to make sure that person has no history of diseases nor engages in risky social behavior (examples are intravenous drug use or sexual activity with multiple partners). The donor's travel history is screened to minimize risk of transmitting infections, such as malaria. The donated blood is tested for signs of infectious diseases, such as HIV and hepatitis. The blood is then tested to be sure it is compatible with you in order to minimize the chance of a transfusion reaction. If you or a relative donates blood, this is often done in anticipation of surgery and is not appropriate for emergency situations. It takes many days to process the donated blood. RISKS AND COMPLICATIONS Although transfusion therapy is very safe and saves many lives, the main dangers of transfusion include:   Getting an infectious disease.  Developing a transfusion reaction. This  is an allergic reaction to something in the blood you were given. Every precaution is taken to prevent this. The decision to have a blood transfusion has been  considered carefully by your caregiver before blood is given. Blood is not given unless the benefits outweigh the risks. AFTER THE TRANSFUSION  Right after receiving a blood transfusion, you will usually feel much better and more energetic. This is especially true if your red blood cells have gotten low (anemic). The transfusion raises the level of the red blood cells which carry oxygen, and this usually causes an energy increase.  The nurse administering the transfusion will monitor you carefully for complications. HOME CARE INSTRUCTIONS  No special instructions are needed after a transfusion. You may find your energy is better. Speak with your caregiver about any limitations on activity for underlying diseases you may have. SEEK MEDICAL CARE IF:   Your condition is not improving after your transfusion.  You develop redness or irritation at the intravenous (IV) site. SEEK IMMEDIATE MEDICAL CARE IF:  Any of the following symptoms occur over the next 12 hours:  Shaking chills.  You have a temperature by mouth above 102 F (38.9 C), not controlled by medicine.  Chest, back, or muscle pain.  People around you feel you are not acting correctly or are confused.  Shortness of breath or difficulty breathing.  Dizziness and fainting.  You get a rash or develop hives.  You have a decrease in urine output.  Your urine turns a dark color or changes to pink, red, or brown. Any of the following symptoms occur over the next 10 days:  You have a temperature by mouth above 102 F (38.9 C), not controlled by medicine.  Shortness of breath.  Weakness after normal activity.  The white part of the eye turns yellow (jaundice).  You have a decrease in the amount of urine or are urinating less often.  Your urine turns a dark color or changes to pink, red, or brown. Document Released: 02/13/2000 Document Revised: 05/10/2011 Document Reviewed: 10/02/2007 ExitCare Patient Information 2014  ExitCare, [place].  _______________________________________________________________________How to Manage Your Diabetes Before and After Surgery  Why is it important to control my blood sugar before and after surgery? . Improving blood sugar levels before and after surgery helps healing and can limit problems. . A way of improving blood sugar control is eating a healthy diet by: o  Eating less sugar and carbohydrates o  Increasing activity/exercise o  Talking with your doctor about reaching your blood sugar goals . High blood sugars (greater than 180 mg/dL) can raise your risk of infections and slow your recovery, so you will need to focus on controlling your diabetes during the weeks before surgery. . Make sure that the doctor who takes care of your diabetes knows about your planned surgery including the date and location.  How do I manage my blood sugar before surgery? . Check your blood sugar at least 4 times a day, starting 2 days before surgery, to make sure that the level is not too high or low. o Check your blood sugar the morning of your surgery when you wake up and every 2 hours until you get to the Short Stay unit. . If your blood sugar is less than 70 mg/dL, you will need to treat for low blood sugar: o Do not take insulin. o Treat a low blood sugar (less than 70 mg/dL) with  cup of clear juice (cranberry or apple), 4 glucose tablets, OR  glucose gel. o Recheck blood sugar in 15 minutes after treatment (to make sure it is greater than 70 mg/dL). If your blood sugar is not greater than 70 mg/dL on recheck, call [phone] for further instructions. . Report your blood sugar to the short stay nurse when you get to Short Stay.  . If you are admitted to the hospital after surgery: o Your blood sugar will be checked by the staff and you will probably be given insulin after surgery (instead of oral diabetes medicines) to make sure you have good blood sugar levels. o The goal for blood sugar  control after surgery is 80-180 mg/dL.   WHAT DO I DO ABOUT MY DIABETES MEDICATION?  Marland Kitchen Do not take oral diabetes medicines (pills) the morning of surgery.  . THE NIGHT BEFORE SURGERY, take  7.5   units of  Lantus     insulin.       . THE MORNING OF SURGERY, take   NO   units of    insulin.  . The day of surgery, do not take other diabetes injectables, including Byetta (exenatide), Bydureon (exenatide ER), Victoza (liraglutide), or Trulicity (dulaglutide).    Patient Signature:  Date:   Nurse Signature:  Date:   Reviewed and Endorsed by [facility] Patient Education Committee, August 2015   CLEAR LIQUID DIET  THE DAY BEFORE SURGERY   Foods Allowed                                                                     Foods Excluded  Coffee and tea, regular and decaf                             liquids that you cannot  Plain Jell-O in any flavor                                             see through such as: Fruit ices (not with fruit pulp)                                     milk, soups, orange juice  Iced Popsicles                                    All solid food Carbonated beverages, regular and diet                                    Cranberry, grape and apple juices Sports drinks like Gatorade Lightly seasoned clear broth or consume(fat free) Sugar, honey syrup  Sample Menu Breakfast                                Lunch  Supper Cranberry juice                    Beef broth                            Chicken broth Jell-O                                     Grape juice                           Apple juice Coffee or tea                        Jell-O                                      Popsicle                                                Coffee or tea                        Coffee or tea  _____________________________________________________________________

## 2016-11-11 ENCOUNTER — Encounter (HOSPITAL_COMMUNITY): Payer: Self-pay

## 2016-11-11 ENCOUNTER — Encounter (HOSPITAL_COMMUNITY)
Admission: RE | Admit: 2016-11-11 | Discharge: 2016-11-11 | Disposition: A | Discharge status: Home / Self Care | Payer: Medicare Other | Source: Ambulatory Visit | Attending: Urology | Admitting: Urology

## 2016-11-11 HISTORY — DX: Type 2 diabetes mellitus without complications: E11.9

## 2016-11-11 LAB — GLUCOSE, CAPILLARY: Glucose-Capillary: 220 mg/dL — ABNORMAL HIGH (ref 65–99)

## 2016-11-11 LAB — ABO/RH: ABO/RH(D): A POS

## 2016-11-11 LAB — CBC
HEMATOCRIT: 43.1 % (ref 39.0–52.0)
Hemoglobin: 15 g/dL (ref 13.0–17.0)
MCH: 30.9 pg (ref 26.0–34.0)
MCHC: 34.8 g/dL (ref 30.0–36.0)
MCV: 88.7 fL (ref 78.0–100.0)
PLATELETS: 145 10*3/uL — AB (ref 150–400)
RBC: 4.86 MIL/uL (ref 4.22–5.81)
RDW: 13.7 % (ref 11.5–15.5)
WBC: 9.2 10*3/uL (ref 4.0–10.5)

## 2016-11-11 LAB — BASIC METABOLIC PANEL
Anion gap: 8 (ref 5–15)
BUN: 11 mg/dL (ref 6–20)
CO2: 27 mmol/L (ref 22–32)
CREATININE: 0.67 mg/dL (ref 0.61–1.24)
Calcium: 9.2 mg/dL (ref 8.9–10.3)
Chloride: 100 mmol/L — ABNORMAL LOW (ref 101–111)
GFR calc Af Amer: >60 mL/min (ref >60)
GFR calc non Af Amer: >60 mL/min (ref >60)
GLUCOSE: 173 mg/dL — AB (ref 65–99)
POTASSIUM: 4.1 mmol/L (ref 3.5–5.1)
SODIUM: 135 mmol/L (ref 135–145)

## 2016-11-11 LAB — HEMOGLOBIN A1C
Hgb A1c MFr Bld: 8.2 % — ABNORMAL HIGH (ref 4.8–5.6)
MEAN PLASMA GLUCOSE: 188.64 mg/dL

## 2016-11-12 ENCOUNTER — Inpatient Hospital Stay (HOSPITAL_COMMUNITY)
Admission: RE | Admit: 2016-11-12 | Discharge: 2016-11-14 | DRG: 658 | Disposition: A | Discharge status: Home / Self Care | Payer: Medicare Other | Source: Ambulatory Visit | Attending: Urology | Admitting: Urology

## 2016-11-12 ENCOUNTER — Ambulatory Visit (HOSPITAL_COMMUNITY): Payer: Medicare Other | Admitting: Anesthesiology

## 2016-11-12 ENCOUNTER — Encounter (HOSPITAL_COMMUNITY)
Admission: RE | Disposition: A | Discharge status: Home / Self Care | Payer: Self-pay | Source: Ambulatory Visit | Attending: Urology

## 2016-11-12 ENCOUNTER — Encounter (HOSPITAL_COMMUNITY): Payer: Self-pay | Admitting: *Deleted

## 2016-11-12 DIAGNOSIS — F17210 Nicotine dependence, cigarettes, uncomplicated: Secondary | ICD-10-CM | POA: Diagnosis present

## 2016-11-12 DIAGNOSIS — E119 Type 2 diabetes mellitus without complications: Secondary | ICD-10-CM | POA: Diagnosis present

## 2016-11-12 DIAGNOSIS — D3002 Benign neoplasm of left kidney: Secondary | ICD-10-CM | POA: Diagnosis present

## 2016-11-12 DIAGNOSIS — Z800 Family history of malignant neoplasm of digestive organs: Secondary | ICD-10-CM

## 2016-11-12 DIAGNOSIS — Z794 Long term (current) use of insulin: Secondary | ICD-10-CM | POA: Diagnosis not present

## 2016-11-12 DIAGNOSIS — I10 Essential (primary) hypertension: Secondary | ICD-10-CM | POA: Diagnosis present

## 2016-11-12 DIAGNOSIS — Z7982 Long term (current) use of aspirin: Secondary | ICD-10-CM

## 2016-11-12 DIAGNOSIS — N2889 Other specified disorders of kidney and ureter: Secondary | ICD-10-CM | POA: Diagnosis present

## 2016-11-12 HISTORY — PX: ROBOTIC ASSITED PARTIAL NEPHRECTOMY: SHX6087

## 2016-11-12 LAB — HEMOGLOBIN AND HEMATOCRIT, BLOOD
HEMATOCRIT: 41.9 % (ref 39.0–52.0)
Hemoglobin: 14.6 g/dL (ref 13.0–17.0)

## 2016-11-12 LAB — TYPE AND SCREEN
ABO/RH(D): A POS
Antibody Screen: NEGATIVE

## 2016-11-12 LAB — GLUCOSE, CAPILLARY
Glucose-Capillary: 165 mg/dL — ABNORMAL HIGH (ref 65–99)
Glucose-Capillary: 232 mg/dL — ABNORMAL HIGH (ref 65–99)
Glucose-Capillary: 188 mg/dL — ABNORMAL HIGH (ref 65–99)
Glucose-Capillary: 244 mg/dL — ABNORMAL HIGH (ref 65–99)

## 2016-11-12 SURGERY — NEPHRECTOMY, PARTIAL, ROBOT-ASSISTED
Anesthesia: General | Laterality: Left

## 2016-11-12 MED ORDER — ARIPIPRAZOLE 5 MG PO TABS
5.0000 mg | ORAL_TABLET | Freq: Every day | ORAL | Status: DC
  Administered 2016-11-12 – 2016-11-13 (×2): 5 mg via ORAL
  Filled 2016-11-12 (×2): qty 1

## 2016-11-12 MED ORDER — LACTATED RINGERS IV SOLN
INTRAVENOUS | Status: DC
  Administered 2016-11-12 (×2): via INTRAVENOUS
  Administered 2016-11-12: 1000 mL via INTRAVENOUS

## 2016-11-12 MED ORDER — FENTANYL CITRATE (PF) 250 MCG/5ML IJ SOLN
INTRAMUSCULAR | Status: AC
  Filled 2016-11-12: qty 5

## 2016-11-12 MED ORDER — ONDANSETRON HCL 4 MG/2ML IJ SOLN: 4.0000 mg | INTRAMUSCULAR | Status: DC | PRN

## 2016-11-12 MED ORDER — HYDROCODONE-ACETAMINOPHEN 5-325 MG PO TABS: 1.0000 | ORAL_TABLET | Freq: Four times a day (QID) | ORAL | 0 refills | Status: DC | PRN

## 2016-11-12 MED ORDER — HYDROMORPHONE HCL-NACL 0.5-0.9 MG/ML-% IV SOSY
0.2500 mg | PREFILLED_SYRINGE | INTRAVENOUS | Status: DC | PRN
  Administered 2016-11-12: 0.5 mg via INTRAVENOUS

## 2016-11-12 MED ORDER — PANTOPRAZOLE SODIUM 40 MG PO TBEC
40.0000 mg | DELAYED_RELEASE_TABLET | Freq: Every day | ORAL | Status: DC
  Administered 2016-11-12 – 2016-11-14 (×3): 40 mg via ORAL
  Filled 2016-11-12 (×3): qty 1

## 2016-11-12 MED ORDER — CEFAZOLIN SODIUM-DEXTROSE 2-4 GM/100ML-% IV SOLN
INTRAVENOUS | Status: AC
  Filled 2016-11-12: qty 100

## 2016-11-12 MED ORDER — KETAMINE HCL 10 MG/ML IJ SOLN
INTRAMUSCULAR | Status: DC | PRN
  Administered 2016-11-12: 10 mg via INTRAVENOUS
  Administered 2016-11-12 (×3): 20 mg via INTRAVENOUS

## 2016-11-12 MED ORDER — STERILE WATER FOR IRRIGATION IR SOLN
Status: DC | PRN
  Administered 2016-11-12: 1000 mL

## 2016-11-12 MED ORDER — DOCUSATE SODIUM 100 MG PO CAPS
100.0000 mg | ORAL_CAPSULE | Freq: Two times a day (BID) | ORAL | Status: DC
  Administered 2016-11-12 – 2016-11-14 (×4): 100 mg via ORAL
  Filled 2016-11-12 (×4): qty 1

## 2016-11-12 MED ORDER — HYDRALAZINE HCL 20 MG/ML IJ SOLN
INTRAMUSCULAR | Status: DC | PRN
  Administered 2016-11-12: 4 mg via INTRAVENOUS

## 2016-11-12 MED ORDER — HYDROMORPHONE HCL-NACL 0.5-0.9 MG/ML-% IV SOSY
PREFILLED_SYRINGE | INTRAVENOUS | Status: AC
  Filled 2016-11-12: qty 4

## 2016-11-12 MED ORDER — CLONAZEPAM 1 MG PO TABS
1.0000 mg | ORAL_TABLET | Freq: Two times a day (BID) | ORAL | Status: DC | PRN
  Administered 2016-11-13: 1 mg via ORAL
  Filled 2016-11-12: qty 1

## 2016-11-12 MED ORDER — LACTATED RINGERS IR SOLN
Status: DC | PRN
  Administered 2016-11-12: 1

## 2016-11-12 MED ORDER — PROPOFOL 10 MG/ML IV BOLUS
INTRAVENOUS | Status: DC | PRN
Start: 2016-11-12 — End: 2016-11-12
  Administered 2016-11-12: 200 mg via INTRAVENOUS

## 2016-11-12 MED ORDER — ROCURONIUM BROMIDE 50 MG/5ML IV SOSY
PREFILLED_SYRINGE | INTRAVENOUS | Status: AC
  Filled 2016-11-12: qty 5

## 2016-11-12 MED ORDER — MIDAZOLAM HCL 5 MG/5ML IJ SOLN
INTRAMUSCULAR | Status: DC | PRN
  Administered 2016-11-12: 2 mg via INTRAVENOUS

## 2016-11-12 MED ORDER — BACLOFEN 10 MG PO TABS: 10.0000 mg | ORAL_TABLET | Freq: Two times a day (BID) | ORAL | Status: DC | PRN

## 2016-11-12 MED ORDER — NICOTINE 14 MG/24HR TD PT24
14.0000 mg | MEDICATED_PATCH | TRANSDERMAL | Status: DC
  Administered 2016-11-12 – 2016-11-13 (×2): 14 mg via TRANSDERMAL
  Filled 2016-11-12 (×2): qty 1

## 2016-11-12 MED ORDER — BUPIVACAINE LIPOSOME 1.3 % IJ SUSP
20.0000 mL | Freq: Once | INTRAMUSCULAR | Status: AC
  Administered 2016-11-12: 20 mL
  Filled 2016-11-12: qty 20

## 2016-11-12 MED ORDER — ROSUVASTATIN CALCIUM 20 MG PO TABS
20.0000 mg | ORAL_TABLET | Freq: Every day | ORAL | Status: DC
  Administered 2016-11-12 – 2016-11-14 (×3): 20 mg via ORAL
  Filled 2016-11-12 (×3): qty 1

## 2016-11-12 MED ORDER — ROCURONIUM BROMIDE 50 MG/5ML IV SOSY
PREFILLED_SYRINGE | INTRAVENOUS | Status: AC
  Filled 2016-11-12: qty 10

## 2016-11-12 MED ORDER — SODIUM CHLORIDE 0.9 % IJ SOLN
INTRAMUSCULAR | Status: DC | PRN
  Administered 2016-11-12: 20 mL

## 2016-11-12 MED ORDER — DIPHENHYDRAMINE HCL 50 MG/ML IJ SOLN: 12.5000 mg | Freq: Four times a day (QID) | INTRAMUSCULAR | Status: DC | PRN

## 2016-11-12 MED ORDER — FENTANYL CITRATE (PF) 100 MCG/2ML IJ SOLN
INTRAMUSCULAR | Status: AC
  Filled 2016-11-12: qty 2

## 2016-11-12 MED ORDER — KETAMINE HCL 10 MG/ML IJ SOLN
INTRAMUSCULAR | Status: AC
  Filled 2016-11-12: qty 1

## 2016-11-12 MED ORDER — OXYCODONE HCL 5 MG PO TABS
5.0000 mg | ORAL_TABLET | ORAL | Status: DC | PRN
  Administered 2016-11-13 – 2016-11-14 (×2): 5 mg via ORAL
  Filled 2016-11-12 (×2): qty 1

## 2016-11-12 MED ORDER — DEXAMETHASONE SODIUM PHOSPHATE 10 MG/ML IJ SOLN
INTRAMUSCULAR | Status: AC
  Filled 2016-11-12: qty 1

## 2016-11-12 MED ORDER — SODIUM CHLORIDE 0.45 % IV SOLN
INTRAVENOUS | Status: DC
  Administered 2016-11-12 (×2): via INTRAVENOUS

## 2016-11-12 MED ORDER — ONDANSETRON HCL 4 MG/2ML IJ SOLN
INTRAMUSCULAR | Status: DC | PRN
  Administered 2016-11-12: 4 mg via INTRAVENOUS

## 2016-11-12 MED ORDER — SUGAMMADEX SODIUM 200 MG/2ML IV SOLN
INTRAVENOUS | Status: DC | PRN
  Administered 2016-11-12: 200 mg via INTRAVENOUS

## 2016-11-12 MED ORDER — FAMOTIDINE 20 MG PO TABS
20.0000 mg | ORAL_TABLET | Freq: Every day | ORAL | Status: DC
  Administered 2016-11-12 – 2016-11-14 (×3): 20 mg via ORAL
  Filled 2016-11-12 (×3): qty 1

## 2016-11-12 MED ORDER — DEXAMETHASONE SODIUM PHOSPHATE 10 MG/ML IJ SOLN
INTRAMUSCULAR | Status: DC | PRN
  Administered 2016-11-12: 5 mg via INTRAVENOUS

## 2016-11-12 MED ORDER — DIPHENHYDRAMINE HCL 12.5 MG/5ML PO ELIX: 12.5000 mg | ORAL_SOLUTION | Freq: Four times a day (QID) | ORAL | Status: DC | PRN

## 2016-11-12 MED ORDER — INSULIN GLARGINE 100 UNIT/ML ~~LOC~~ SOLN
50.0000 [IU] | Freq: Every day | SUBCUTANEOUS | Status: DC
  Administered 2016-11-12 – 2016-11-13 (×2): 50 [IU] via SUBCUTANEOUS
  Filled 2016-11-12 (×2): qty 0.5

## 2016-11-12 MED ORDER — ALBUTEROL SULFATE (2.5 MG/3ML) 0.083% IN NEBU: 2.5000 mg | INHALATION_SOLUTION | RESPIRATORY_TRACT | Status: DC | PRN

## 2016-11-12 MED ORDER — PROPOFOL 10 MG/ML IV BOLUS
INTRAVENOUS | Status: AC
  Filled 2016-11-12: qty 20

## 2016-11-12 MED ORDER — HYDROMORPHONE HCL-NACL 0.5-0.9 MG/ML-% IV SOSY
0.5000 mg | PREFILLED_SYRINGE | INTRAVENOUS | Status: DC | PRN
  Administered 2016-11-12 – 2016-11-13 (×8): 1 mg via INTRAVENOUS
  Filled 2016-11-12 (×8): qty 2

## 2016-11-12 MED ORDER — SODIUM CHLORIDE 0.9 % IJ SOLN
INTRAMUSCULAR | Status: AC
  Filled 2016-11-12: qty 10

## 2016-11-12 MED ORDER — FENTANYL CITRATE (PF) 100 MCG/2ML IJ SOLN
INTRAMUSCULAR | Status: DC | PRN
  Administered 2016-11-12 (×4): 50 ug via INTRAVENOUS
  Administered 2016-11-12: 100 ug via INTRAVENOUS
  Administered 2016-11-12: 50 ug via INTRAVENOUS

## 2016-11-12 MED ORDER — CEFAZOLIN SODIUM-DEXTROSE 2-4 GM/100ML-% IV SOLN
2.0000 g | INTRAVENOUS | Status: AC
  Administered 2016-11-12: 2 g via INTRAVENOUS

## 2016-11-12 MED ORDER — MIDAZOLAM HCL 2 MG/2ML IJ SOLN
INTRAMUSCULAR | Status: AC
  Filled 2016-11-12: qty 2

## 2016-11-12 MED ORDER — OXYCODONE HCL 5 MG PO TABS: 5.0000 mg | ORAL_TABLET | Freq: Four times a day (QID) | ORAL | 0 refills | Status: AC | PRN

## 2016-11-12 MED ORDER — LIDOCAINE 2% (20 MG/ML) 5 ML SYRINGE
INTRAMUSCULAR | Status: AC
  Filled 2016-11-12: qty 5

## 2016-11-12 MED ORDER — SUGAMMADEX SODIUM 200 MG/2ML IV SOLN
INTRAVENOUS | Status: AC
  Filled 2016-11-12: qty 2

## 2016-11-12 MED ORDER — SODIUM CHLORIDE 0.9 % IJ SOLN
INTRAMUSCULAR | Status: AC
  Filled 2016-11-12: qty 20

## 2016-11-12 MED ORDER — INSULIN ASPART 100 UNIT/ML ~~LOC~~ SOLN
0.0000 [IU] | Freq: Three times a day (TID) | SUBCUTANEOUS | Status: DC
  Administered 2016-11-12: 3 [IU] via SUBCUTANEOUS
  Administered 2016-11-13 (×2): 2 [IU] via SUBCUTANEOUS
  Administered 2016-11-13: 3 [IU] via SUBCUTANEOUS
  Administered 2016-11-14: 2 [IU] via SUBCUTANEOUS

## 2016-11-12 MED ORDER — LIDOCAINE HCL (CARDIAC) 20 MG/ML IV SOLN
INTRAVENOUS | Status: DC | PRN
  Administered 2016-11-12: 100 mg via INTRAVENOUS

## 2016-11-12 MED ORDER — SERTRALINE HCL 100 MG PO TABS: 100.0000 mg | ORAL_TABLET | Freq: Every day | ORAL | Status: DC | PRN

## 2016-11-12 MED ORDER — PROMETHAZINE HCL 25 MG/ML IJ SOLN: 6.2500 mg | INTRAMUSCULAR | Status: DC | PRN

## 2016-11-12 MED ORDER — ROCURONIUM BROMIDE 100 MG/10ML IV SOLN
INTRAVENOUS | Status: DC | PRN
  Administered 2016-11-12: 20 mg via INTRAVENOUS
  Administered 2016-11-12: 10 mg via INTRAVENOUS
  Administered 2016-11-12: 50 mg via INTRAVENOUS
  Administered 2016-11-12: 20 mg via INTRAVENOUS

## 2016-11-12 MED ORDER — ONDANSETRON HCL 4 MG/2ML IJ SOLN
INTRAMUSCULAR | Status: AC
  Filled 2016-11-12: qty 2

## 2016-11-12 SURGICAL SUPPLY — 71 items
APPLICATOR SURGIFLO ENDO (HEMOSTASIS) ×3 IMPLANT
CHLORAPREP W/TINT 26ML (MISCELLANEOUS) ×3 IMPLANT
CLIP SUT LAPRA TY ABSORB (SUTURE) ×9 IMPLANT
CLIP VESOLOCK LG 6/CT PURPLE (CLIP) ×3 IMPLANT
CLIP VESOLOCK MED LG 6/CT (CLIP) ×15 IMPLANT
CLIP VESOLOCK XL 6/CT (CLIP) IMPLANT
COVER SURGICAL LIGHT HANDLE (MISCELLANEOUS) ×3 IMPLANT
COVER TIP SHEARS 8 DVNC (MISCELLANEOUS) ×1 IMPLANT
COVER TIP SHEARS 8MM DA VINCI (MISCELLANEOUS) ×2
DECANTER SPIKE VIAL GLASS SM (MISCELLANEOUS) ×3 IMPLANT
DERMABOND ADVANCED (GAUZE/BANDAGES/DRESSINGS) ×2
DERMABOND ADVANCED .7 DNX12 (GAUZE/BANDAGES/DRESSINGS) ×1 IMPLANT
DRAIN CHANNEL 15F RND FF 3/16 (WOUND CARE) ×3 IMPLANT
DRAPE ARM DVNC X/XI (DISPOSABLE) ×4 IMPLANT
DRAPE COLUMN DVNC XI (DISPOSABLE) ×1 IMPLANT
DRAPE DA VINCI XI ARM (DISPOSABLE) ×8
DRAPE DA VINCI XI COLUMN (DISPOSABLE) ×2
DRAPE INCISE IOBAN 66X45 STRL (DRAPES) ×3 IMPLANT
DRAPE LAPAROSCOPIC ABDOMINAL (DRAPES) ×3 IMPLANT
DRAPE SHEET LG 3/4 BI-LAMINATE (DRAPES) ×3 IMPLANT
DRSG TEGADERM 4X4.75 (GAUZE/BANDAGES/DRESSINGS) ×3 IMPLANT
ELECT PENCIL ROCKER SW 15FT (MISCELLANEOUS) ×3 IMPLANT
ELECT REM PT RETURN 15FT ADLT (MISCELLANEOUS) ×3 IMPLANT
EVACUATOR SILICONE 100CC (DRAIN) ×3 IMPLANT
GAUZE SPONGE 2X2 8PLY STRL LF (GAUZE/BANDAGES/DRESSINGS) ×1 IMPLANT
GLOVE BIO SURGEON STRL SZ 6.5 (GLOVE) ×2 IMPLANT
GLOVE BIO SURGEONS STRL SZ 6.5 (GLOVE) ×1
GLOVE BIOGEL M STRL SZ7.5 (GLOVE) ×6 IMPLANT
GOWN STRL REUS W/TWL LRG LVL3 (GOWN DISPOSABLE) ×6 IMPLANT
HEMOSTAT SURGICEL 4X8 (HEMOSTASIS) ×6 IMPLANT
IRRIG SUCT STRYKERFLOW 2 WTIP (MISCELLANEOUS) ×3
IRRIGATION SUCT STRKRFLW 2 WTP (MISCELLANEOUS) ×1 IMPLANT
KIT BASIN OR (CUSTOM PROCEDURE TRAY) ×3 IMPLANT
LOOP VESSEL MAXI BLUE (MISCELLANEOUS) ×3 IMPLANT
MARKER SKIN DUAL TIP RULER LAB (MISCELLANEOUS) ×3 IMPLANT
NEEDLE INSUFFLATION 14GA 120MM (NEEDLE) ×3 IMPLANT
NS IRRIG 1000ML POUR BTL (IV SOLUTION) ×3 IMPLANT
PORT ACCESS TROCAR AIRSEAL 12 (TROCAR) ×1 IMPLANT
PORT ACCESS TROCAR AIRSEAL 5M (TROCAR) ×2
POSITIONER SURGICAL ARM (MISCELLANEOUS) ×6 IMPLANT
POUCH SPECIMEN RETRIEVAL 10MM (ENDOMECHANICALS) ×3 IMPLANT
RELOAD STAPLER WHITE 60MM (STAPLE) IMPLANT
SEAL CANN UNIV 5-8 DVNC XI (MISCELLANEOUS) ×4 IMPLANT
SEAL XI 5MM-8MM UNIVERSAL (MISCELLANEOUS) ×8
SET TRI-LUMEN FLTR TB AIRSEAL (TUBING) ×3 IMPLANT
SOLUTION ELECTROLUBE (MISCELLANEOUS) ×3 IMPLANT
SPONGE GAUZE 2X2 STER 10/PKG (GAUZE/BANDAGES/DRESSINGS) ×2
SPONGE LAP 4X18 X RAY DECT (DISPOSABLE) ×3 IMPLANT
STAPLE ECHEON FLEX 60 POW ENDO (STAPLE) IMPLANT
STAPLER RELOAD WHITE 60MM (STAPLE)
SURGIFLO W/THROMBIN 8M KIT (HEMOSTASIS) ×3 IMPLANT
SUT ETHILON 3 0 PS 1 (SUTURE) ×3 IMPLANT
SUT MNCRL AB 4-0 PS2 18 (SUTURE) ×6 IMPLANT
SUT PDS AB 1 CT1 27 (SUTURE) ×6 IMPLANT
SUT V-LOC BARB 180 2/0GR6 GS22 (SUTURE)
SUT VIC AB 0 CT1 27 (SUTURE) ×8
SUT VIC AB 0 CT1 27XBRD ANTBC (SUTURE) ×4 IMPLANT
SUT VIC AB 2-0 SH 27 (SUTURE) ×4
SUT VIC AB 2-0 SH 27X BRD (SUTURE) ×2 IMPLANT
SUT VLOC 180 3-0 18IN (SUTURE) ×3 IMPLANT
SUT VLOC BARB 180 ABS3/0GR12 (SUTURE) ×6
SUTURE V-LC BRB 180 2/0GR6GS22 (SUTURE) IMPLANT
SUTURE VLOC BRB 180 ABS3/0GR12 (SUTURE) ×2 IMPLANT
TAPE STRIPS DRAPE STRL (GAUZE/BANDAGES/DRESSINGS) ×3 IMPLANT
TOWEL OR 17X26 10 PK STRL BLUE (TOWEL DISPOSABLE) ×6 IMPLANT
TOWEL OR NON WOVEN STRL DISP B (DISPOSABLE) ×3 IMPLANT
TRAY FOLEY W/METER SILVER 16FR (SET/KITS/TRAYS/PACK) ×3 IMPLANT
TRAY LAPAROSCOPIC (CUSTOM PROCEDURE TRAY) ×3 IMPLANT
TROCAR BLADELESS OPT 5 100 (ENDOMECHANICALS) IMPLANT
TROCAR XCEL 12X100 BLDLESS (ENDOMECHANICALS) ×3 IMPLANT
WATER STERILE IRR 1000ML POUR (IV SOLUTION) ×6 IMPLANT

## 2016-11-12 NOTE — Anesthesia Postprocedure Evaluation (Signed)
Anesthesia Post Note  Patient: Curtis Kim  Procedure(s) Performed: Procedure(s) (LRB): XI ROBOTIC ASSITED PARTIAL NEPHRECTOMY (Left)     Patient location during evaluation: PACU Anesthesia Type: General Level of consciousness: awake and alert Pain management: pain level controlled Vital Signs Assessment: post-procedure vital signs reviewed and stable Respiratory status: spontaneous breathing, nonlabored ventilation, respiratory function stable and patient connected to nasal cannula oxygen Cardiovascular status: blood pressure returned to baseline and stable Postop Assessment: no apparent nausea or vomiting Anesthetic complications: no    Last Vitals:  Vitals:   11/12/16 1215 11/12/16 1236  BP: (!) 142/93 (!) 142/92  Pulse: 89 85  Resp: 18 18  Temp: 36.6 C 36.8 C  SpO2: 97% 96%    Last Pain:  Vitals:   11/12/16 1215  TempSrc:   PainSc: Asleep                 [name]

## 2016-11-12 NOTE — Anesthesia Preprocedure Evaluation (Addendum)
Anesthesia Evaluation  Patient identified by MRN, date of birth, ID band Patient awake    Reviewed: Allergy & Precautions, NPO status , Patient's Chart, lab work & pertinent test results  Airway Mallampati: II  TM Distance: >3 FB Neck ROM: Full    Dental no notable dental hx.    Pulmonary Current Smoker,    Pulmonary exam normal breath sounds clear to auscultation       Cardiovascular hypertension, Normal cardiovascular exam Rhythm:Regular Rate:Normal     Neuro/Psych negative neurological ROS  negative psych ROS   GI/Hepatic negative GI ROS, Neg liver ROS,   Endo/Other  diabetes  Renal/GU negative Renal ROS  negative genitourinary   Musculoskeletal negative musculoskeletal ROS (+)   Abdominal   Peds negative pediatric ROS (+)  Hematology negative hematology ROS (+)   Anesthesia Other Findings   Reproductive/Obstetrics negative OB ROS                             Anesthesia Physical Anesthesia Plan  ASA: II  Anesthesia Plan: General   Post-op Pain Management:    Induction: Intravenous  PONV Risk Score and Plan: 1 and Ondansetron and Dexamethasone  Airway Management Planned: Oral ETT  Additional Equipment:   Intra-op Plan:   Post-operative Plan: Extubation in OR  Informed Consent: I have reviewed the patients History and Physical, chart, labs and discussed the procedure including the risks, benefits and alternatives for the proposed anesthesia with the patient or authorized representative who has indicated his/her understanding and acceptance.   Dental advisory given  Plan Discussed with: CRNA and Surgeon  Anesthesia Plan Comments:         Anesthesia Quick Evaluation

## 2016-11-12 NOTE — Brief Op Note (Signed)
11/12/2016  10:39 AM  PATIENT:  Nils Flack  61 y.o. male  PRE-OPERATIVE DIAGNOSIS:  LEFT RENAL MASS  POST-OPERATIVE DIAGNOSIS:  LEFT RENAL MASS  PROCEDURE:  Procedure(s): XI ROBOTIC ASSITED PARTIAL NEPHRECTOMY (Left)  SURGEON:  Surgeon(s) and Role:    Alexis Frock, MD - Primary  PHYSICIAN ASSISTANT:   ASSISTANTS: Debbrah Alar PA   ANESTHESIA:   local and general  EBL:  Total I/O In: 1000 [I.V.:1000] Out: 300 [Urine:150; Blood:150]  BLOOD ADMINISTERED:none  DRAINS: 1 - JP to bulb, 2 - Foley to gravity   LOCAL MEDICATIONS USED:  MARCAINE     SPECIMEN:  Source of Specimen:  1 - left partial nephrectomy, 2 - deep margin  DISPOSITION OF SPECIMEN:  PATHOLOGY  COUNTS:  YES  TOURNIQUET:  * No tourniquets in log *  DICTATION: .Other Dictation: Dictation Number  [id number]  PLAN OF CARE: Admit to inpatient   PATIENT DISPOSITION:  PACU - hemodynamically stable.   Delay start of Pharmacological VTE agent (>24hrs) due to surgical blood loss or risk of bleeding: yes

## 2016-11-12 NOTE — Discharge Instructions (Signed)

## 2016-11-12 NOTE — Anesthesia Procedure Notes (Signed)
Procedure Name: Intubation Date/Time: 11/12/2016 7:40 AM Performed by: Glory Buff Pre-anesthesia Checklist: Patient identified, Emergency Drugs available, Suction available and Patient being monitored Patient Re-evaluated:Patient Re-evaluated prior to induction Oxygen Delivery Method: Circle system utilized Preoxygenation: Pre-oxygenation with 100% oxygen Induction Type: IV induction Ventilation: Mask ventilation without difficulty Laryngoscope Size: Miller and 3 Grade View: Grade I Tube type: Oral Tube size: 7.5 mm Number of attempts: 1 Airway Equipment and Method: Stylet and Oral airway Placement Confirmation: ETT inserted through vocal cords under direct vision,  positive ETCO2 and breath sounds checked- equal and bilateral Secured at: 22 cm Tube secured with: Tape Dental Injury: Teeth and Oropharynx as per pre-operative assessment
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
## 2016-11-12 NOTE — Interval H&P Note (Signed)
History and Physical Interval Note:  11/12/2016 7:27 AM  Curtis Kim  has presented today for surgery, with the diagnosis of LEFT RENAL MASS  The various methods of treatment have been discussed with the patient and family. After consideration of risks, benefits and other options for treatment, the patient has consented to  Procedure(s): XI ROBOTIC ASSITED PARTIAL NEPHRECTOMY (Left) as a surgical intervention .  The patient's history has been reviewed, patient examined, no change in status, stable for surgery.  I have reviewed the patient's chart and labs.  Questions were answered to the patient's satisfaction.     Shadai Mcclane

## 2016-11-12 NOTE — Transfer of Care (Signed)
Immediate Anesthesia Transfer of Care Note  Patient: Jordin Vicencio  Procedure(s) Performed: Procedure(s): XI ROBOTIC ASSITED PARTIAL NEPHRECTOMY (Left)  Patient Location: PACU  Anesthesia Type:General  Level of Consciousness: Patient easily awoken, sedated, comfortable, cooperative, following commands, responds to stimulation.   Airway & Oxygen Therapy: Patient spontaneously breathing, ventilating well, oxygen via simple oxygen mask.  Post-op Assessment: Report given to PACU RN, vital signs reviewed and stable, moving all extremities.   Post vital signs: Reviewed and stable.  Complications: No apparent anesthesia complications  Last Vitals:  Vitals:   11/12/16 0545 11/12/16 1100  BP: 123/75 129/74  Pulse: 86 85  Resp: 18   Temp: 36.8 C   SpO2: 100% 99%    Last Pain:  Vitals:   11/12/16 0601  TempSrc:   PainSc: 0-No pain      Patients Stated Pain Goal: 3 (04/59/97 7414)  Complications: No apparent anesthesia complications

## 2016-11-13 LAB — GLUCOSE, CAPILLARY
GLUCOSE-CAPILLARY: 171 mg/dL — AB (ref 65–99)
GLUCOSE-CAPILLARY: 142 mg/dL — AB (ref 65–99)
Glucose-Capillary: 138 mg/dL — ABNORMAL HIGH (ref 65–99)
Glucose-Capillary: 129 mg/dL — ABNORMAL HIGH (ref 65–99)

## 2016-11-13 LAB — BASIC METABOLIC PANEL
Anion gap: 7 (ref 5–15)
BUN: 8 mg/dL (ref 6–20)
CALCIUM: 8.2 mg/dL — AB (ref 8.9–10.3)
CO2: 26 mmol/L (ref 22–32)
CREATININE: 0.76 mg/dL (ref 0.61–1.24)
Chloride: 103 mmol/L (ref 101–111)
GFR calc non Af Amer: >60 mL/min (ref >60)
GLUCOSE: 133 mg/dL — AB (ref 65–99)
Potassium: 3.6 mmol/L (ref 3.5–5.1)
Sodium: 136 mmol/L (ref 135–145)

## 2016-11-13 LAB — HEMOGLOBIN AND HEMATOCRIT, BLOOD
HEMATOCRIT: 40.2 % (ref 39.0–52.0)
Hemoglobin: 13.9 g/dL (ref 13.0–17.0)

## 2016-11-13 LAB — CREATININE, FLUID (PLEURAL, PERITONEAL, JP DRAINAGE): CREAT FL: 0.8 mg/dL

## 2016-11-13 MED ORDER — MENTHOL 3 MG MT LOZG
1.0000 | LOZENGE | OROMUCOSAL | Status: DC | PRN
  Filled 2016-11-13: qty 9

## 2016-11-13 NOTE — Progress Notes (Signed)
Urology Progress Note   1 Day Post-Op  Subjective: NAEON. Mild pain, no complaints otherwise.   Objective: Vital signs in last 24 hours: Temp:  [97.8 F (36.6 C)-98.5 F (36.9 C)] 98.5 F (36.9 C) (09/15 0527) Pulse Rate:  [66-90] 87 (09/15 0527) Resp:  [16-22] 18 (09/15 0527) BP: (117-161)/(74-94) 117/81 (09/15 0527) SpO2:  [92 %-100 %] 97 % (09/15 0527) Weight:  [81.4 kg (179 lb 7.3 oz)] 81.4 kg (179 lb 7.3 oz) (09/14 1236)  Intake/Output from previous day: 09/14 0701 - 09/15 0700 In: 3283.3 [I.V.:3283.3] Out: 1520 [Urine:1320; Drains:50; Blood:150] Intake/Output this shift: No intake/output data recorded.  Physical Exam:  General: Alert and oriented CV: RRR Lungs: Clear Abdomen: Soft, appropriately tender. Incisions c/d/i. JP SS GU: Foley in place draining clear yellow urine  Ext: NT, No erythema  Lab Results:  Recent Labs  11/11/16 1141 11/12/16 1139 11/13/16 0444  HGB 15.0 14.6 13.9  HCT 43.1 41.9 40.2   BMET  Recent Labs  11/11/16 1141 11/13/16 0444  NA 135 136  K 4.1 3.6  CL 100* 103  CO2 27 26  GLUCOSE 173* 133*  BUN 11 8  CREATININE 0.67 0.76  CALCIUM 9.2 8.2*     Studies/Results: No results found.  Assessment/Plan:  61 y.o. male s/p right robotic partial nephrectomy for a large peri-hilar tumor.  Overall doing well post-op with normal vitals and expected labs and outputs. JP output low and SS.   - Med lock, regular diet - OOB, IS - Check JP Cr given extent of tumor.    Dispo: Home likely tomorrow   LOS: 1 day   Stasia Cavalier 11/13/2016, 7:27 AM   Patient was seen, examined,treatment plan was discussed with the resident.  I have directly reviewed the clinical findings, lab, imaging studies and management of this patient in detail. I have made the necessary changes and/or additions to the above noted documentation, and agree with the documentation, as recorded by the resident.

## 2016-11-14 LAB — GLUCOSE, CAPILLARY: GLUCOSE-CAPILLARY: 129 mg/dL — AB (ref 65–99)

## 2016-11-14 NOTE — Progress Notes (Signed)
Patient and family given discharge, follow up, and medication instructions, verbalized understanding, IV removed, prescription and personal belongings with patient, family to transport home  

## 2016-11-14 NOTE — Discharge Summary (Signed)
[facility] Discharge Summary  Admit date: 11/12/2016  Discharge date and time: 11/14/16   Discharge to: Home  Discharge Service: Urology  Discharge Attending Physician:  Dr. Karsten Ro  Discharge  Diagnoses: Left renal mass  Secondary Diagnosis: Active Problems:   Renal mass   OR Procedures: Procedure(s): XI ROBOTIC ASSITED PARTIAL NEPHRECTOMY 11/12/2016   Ancillary Procedures: None   Discharge Day Services: The patient was seen and examined by the Urology team both in the morning and immediately prior to discharge.  Vital signs and laboratory values were stable and within normal limits.  The physical exam was benign and unchanged and all surgical wounds were examined.  Discharge instructions were explained and all questions answered.  Subjective  No acute events overnight. Pain Controlled. Vitals within normal limits  Objective Patient Vitals for the past 8 hrs:  BP Temp Temp src Pulse Resp SpO2  11/14/16 0525 (!) 141/80 99 F (37.2 C) Oral (!) 101 17 97 %   No intake/output data recorded.  General Appearance:        No acute distress Lungs:                       Normal work of breathing on room air Heart:                                Regular rate and rhythm Abdomen:                         Soft, non-tender, non-distended. Incisions c/d/i. Former JP site dressed appropriately.  Extremities:                      Warm and well perfused   Hospital Course:  The patient underwent robotic assisted left partial nephrectomy on 11/12/2016.  The patient tolerated the procedure well, was extubated in the OR, and afterwards was taken to the PACU for routine post-surgical care. When stable the patient was transferred to the floor.   The patient did well postoperatively.  The patient's diet was slowly advanced and at the time of discharge was tolerating a regular diet.  His JP Cr was checked and was consistent with serum, and so the JP was removed prior to discharge. The patient was  discharged home 2 Days Post-Op, at which point was tolerating a regular solid diet, was able to void spontaneously, have adequate pain control with P.O. pain medication, and could ambulate without difficulty. The patient will follow up with [place] for post op check.   Condition at Discharge: Improved  Discharge Medications:  Allergies as of 11/14/2016   No Known Allergies     Medication List    STOP taking these medications   aspirin 81 MG chewable tablet   traMADol 50 MG tablet Commonly known as:  ULTRAM   VITAMIN B-12 PO     TAKE these medications   albuterol 108 (90 Base) MCG/ACT inhaler Commonly known as:  PROVENTIL HFA;VENTOLIN HFA Inhale 2 puffs into the lungs every 4 (four) hours as needed for wheezing or shortness of breath.   ARIPiprazole 5 MG tablet Commonly known as:  ABILIFY Take 5 mg by mouth at bedtime.   baclofen 10 MG tablet Commonly known as:  LIORESAL Take 10 mg by mouth 2 (two) times daily as needed (for headaches).   clonazePAM 1 MG tablet Commonly known as:  Bobbye [place]  Take 1 mg by mouth 2 (two) times daily as needed for anxiety.   cyclobenzaprine 10 MG tablet Commonly known as:  FLEXERIL Take 10 mg by mouth 2 (two) times daily as needed for muscle spasms.   diltiazem 2 % Gel Apply 1 application topically 3 (three) times daily. What changed:  when to take this  reasons to take this  additional instructions   docusate sodium 100 MG capsule Commonly known as:  COLACE Take 1 capsule (100 mg total) by mouth every 12 (twelve) hours.   hydrOXYzine 25 MG tablet Commonly known as:  ATARAX/VISTARIL Take 25 mg by mouth every 6 (six) hours as needed for itching.   imipramine 25 MG tablet Commonly known as:  TOFRANIL Take 50 mg by mouth daily as needed (for headaches).   Insulin Glargine 100 UNIT/ML Solostar Pen Commonly known as:  LANTUS Inject 10 Units into the skin daily at 10 pm.   lisinopril-hydrochlorothiazide 10-12.5 MG tablet Commonly  known as:  PRINZIDE,ZESTORETIC Take 1 tablet by mouth 2 (two) times daily.   loratadine 10 MG tablet Commonly known as:  CLARITIN Take 10 mg by mouth daily.   metFORMIN 500 MG tablet Commonly known as:  GLUCOPHAGE Take 1 tablet (500 mg total) by mouth 2 (two) times daily with a meal.   omeprazole 20 MG capsule Commonly known as:  PRILOSEC Take 20 mg by mouth daily before breakfast.   oxyCODONE 5 MG immediate release tablet Commonly known as:  ROXICODONE Take 1-2 tablets (5-10 mg total) by mouth every 6 (six) hours as needed for moderate pain or severe pain.   ranitidine 300 MG tablet Commonly known as:  ZANTAC Take 300 mg by mouth at bedtime.   rosuvastatin 20 MG tablet Commonly known as:  CRESTOR Take 20 mg by mouth daily.   sertraline 100 MG tablet Commonly known as:  ZOLOFT Take 100 mg by mouth daily as needed (for depression).   triamcinolone 0.025 % ointment Commonly known as:  KENALOG Apply 1 application topically 2 (two) times daily as needed. To chest and back   UNABLE TO FIND Glucometer #1 Insulin pen needles # 1 box Glucose test strips #1 box Lancets #1 box            Discharge Care Instructions        Start     Ordered   11/13/16 0000  oxyCODONE (ROXICODONE) 5 MG immediate release tablet  Every 6 hours PRN    Question:  Supervising Provider  Answer:  Alexis Frock   11/12/16 1043      Patient was seen, examined,treatment plan was discussed with the resident.  I have directly reviewed the clinical findings, lab, imaging studies and management of this patient in detail. I have made the necessary changes and/or additions to the above noted documentation, and agree with the documentation, as recorded by the resident.

## 2016-11-15 NOTE — Op Note (Signed)
NAME:  Curtis Kim                   ACCOUNT NO.:  MEDICAL RECORD NO.:  [id number]  LOCATION:                                 FACILITY:  PHYSICIAN:  Alexis Frock, MD          DATE OF BIRTH:  DATE OF PROCEDURE:  11/12/2016                              OPERATIVE REPORT   PROCEDURE: 1. Robotic-assisted laparoscopic left partial nephrectomy. 2. Intraoperative ultrasound with interpretation.  ASSISTANT:  Debbrah Alar, PA.  ESTIMATED BLOOD LOSS:  150 mL.  COMPLICATION:  None.  SPECIMEN: 1. Left partial nephrectomy for permanent pathology. 2. Deep margin left partial nephrectomy for permanent pathology.  FINDINGS: 1. Single artery, single vein, left renovascular anatomy with large     lumbar vein as anticipated. 2. Quite endophytic medial superior renal mass directly abutting hilar     vessels as anticipated.  INDICATION:  [name] is pleasant 61 year old gentleman, who was found incidentally during admission for mild pancreatitis to have an enlarging enhancing left upper pole renal mass.  He also has new very impressive diabetes.  Renal function is currently normal.  His mass was worrisome for localized renal neoplasm such as renal cell carcinoma versus more indolent form such as oncocytoma, but given mass certainly felt that primary therapy was warranted.  Options were discussed including surveillance protocols versus ablative therapy versus surgical extirpation with and without nephron sparing.  Given the patient's diabetes and need for maximal future GFR, it was felt that an attempt at partial nephrectomy versus radical nephrectomy will be most prudent and he wished to proceed.  Informed consent was obtained and placed in the medical record.  PROCEDURE IN DETAIL:  The patient being, Curtis Kim, was verified. Procedure being, left robotic partial nephrectomy, was confirmed. Procedure was carried out.  Time-out was performed.  Intravenous antibiotics were  administered.  General endotracheal anesthesia introduced.  The patient placed into a left side up full flank position, applying 15 degrees of stable flexion, superior arm elevator, axillary roll, sequential compression devices, bottom leg bent, top leg straight. He was further fashioned to the operating table using 3-inch tape over foam padding across his supraxiphoid chest and his pelvis.  Foley catheter was placed per urethra to straight drain.  Sterile field was created by first clipper shaving and prepping and draping the patient's entire left flank using chlorhexidine gluconate and a high-flow, low pressure pneumoperitoneum was obtained using Veress technique in the left lower quadrant having passed the aspiration and drop test.  Next, an 8 mm robotic camera port placed and positioned approximately a handbreadth superomedial to the umbilicus.  Laparoscopic examination of the peritoneal cavity revealed no significant adhesions and no visceral injury.  Additional ports were placed as follows:  Left subcostal 8-mm robotic port, left far lateral 8-mm robotic port approximately 3 fingerbreadths superomedial to the anterior iliac spine.  Left inferior paramedian robotic port approximately 1 handbreadth superior to the pubic ramus and two 12-mm assistant port sites in the midline, one in the supraumbilical crease and another approximately 4 cm above this AirSeal type.  Robot was docked and passed through the electronic checks.  Initial attention was directed at  development of retroperitoneum.  Incision was made lateral to the descending colon from the area of the splenic flexure towards the area of the internal ring, and the colon was carefully swept medially.  Next, lateral splenic attachments were released to allow the spleen and tail of the pancreas to rotate medially off the surface anterior to Gerota's fascia.  Lower pole of kidney area was identified and placed on gentle lateral  traction.  Dissection proceeded medial to this.  Gonadal vein and ureter were encountered, placed on gentle lateral traction. Dissection proceeded within this triangle and towards the area of the renal hilum.  Renal hilum consisted of a single artery, single vein, renovascular anatomy as anticipated.  There was a large lumbar vein also as anticipated.  Both were circumferentially mobilized.  The artery was marked with vessel loop.  Attention was directed at identification of the mass.  The fairly large renal mass was encountered as expected in the medial orientation just superior to the hilar vessels and was intermittently involved with the hilar vessels including superior branches of the renal artery.  Very careful medial dissection was performed along the hilar vessels and mass and several small perforators were noted and controlled using small laparoscopic clips.  The anterior, superior, and posterior surface of the kidney were defatted to better allow visualization of the mass and parenchymal junction. Intraoperative ultrasound was performed.  Intraoperative ultrasound revealed a solid, but with cystic component mass relative to renal parenchyma.  This was approximately 50% endophytic as anticipated.  Using combination of external cues and ultrasound vision, the edges were presumed.  Partial nephrectomy was carefully marked on the anterior, medial, and inferior aspects of the kidney.  Next, warm ischemia was achieved using two bulldog clamps on the renal artery and partial nephrectomy was performed in a very careful systematic fashion keeping what appeared to be a rim of normal parenchyma with the partial nephrectomy specimen.  As anticipated, there is a purposeful entrance into collecting system as well as hilar fat. Several additional small perforating vessels from both main trunk of the renal artery and renal vein were controlled using laparoscopic clips and set aside for  permanent pathology.  First layer renorrhaphy was performed using running 3-0 V-Loc suture x2.  Reapproximating several small venous sinuses.  Reapproximating a purposeful entrance into the upper pole collecting system.  Blood clamps were then removed for total warm ischemia time of 27 minutes.  Next layer of renorrhaphy was performed by placing 2 bolsters along the long axis of the partial nephrectomy and parenchymal apposition sutures were placed 0 Vicryl sandwiched between Hem-o-lok and Lapra-Ty x3, which resulted in excellent parenchymal apposition.  At this point, hemostasis was excellent.  Sponge and needle counts were correct.  A 10 mL of Floseal was applied on the area of the partial nephrectomy.  The retroperitoneum was re-established with running Vicryl over Gerota's fascia.  Vessel loop was removed.  Specimen was placed into an EndoCatch bag for later retrieval.  Closed suction drain was brought through the previous left lateral most robotic port site near the peritoneal cavity. Robot was then undocked.  The superior most assistant port site was closed at the level of the fascia using Carter-Thomason suture passer and 0 Vicryl.  Specimen was retrieved by extending the previous inferior most assistant port site inferiorly for distance approximately 4 cm, removing the partial nephrectomy specimen and setting it aside for permanent pathology.  Notably, deep margin had already been set aside separately.  Extraction site  was closed to the level of the fascia using figure-of-eight PDS x3, followed by reapproximation of Scarpa's with running Vicryl.  All incision sites were infiltrated with dilute lyophilized Marcaine and closed at the level of the skin using subcuticular Monocryl, followed by Dermabond.  Drain stitch was applied.  Procedure was then terminated.  The patient tolerated procedure well.  There were no immediate periprocedural complications.  The patient was taken to  the postanesthesia care unit in stable condition.          ______________________________ Alexis Frock, MD     TM/MEDQ  D:  11/12/2016  T:  11/12/2016  Job:  [id number]

## 2017-02-08 DIAGNOSIS — F332 Major depressive disorder, recurrent severe without psychotic features: Secondary | ICD-10-CM | POA: Diagnosis not present

## 2017-02-14 DIAGNOSIS — W000XXA Fall on same level due to ice and snow, initial encounter: Secondary | ICD-10-CM | POA: Diagnosis not present

## 2017-02-14 DIAGNOSIS — M545 Low back pain: Secondary | ICD-10-CM | POA: Diagnosis not present

## 2017-02-14 DIAGNOSIS — W19XXXA Unspecified fall, initial encounter: Secondary | ICD-10-CM | POA: Diagnosis not present

## 2017-02-15 ENCOUNTER — Other Ambulatory Visit: Payer: Self-pay | Admitting: Family

## 2017-02-15 ENCOUNTER — Ambulatory Visit
Admission: RE | Admit: 2017-02-15 | Discharge: 2017-02-15 | Disposition: A | Discharge status: Home / Self Care | Payer: Medicare HMO | Source: Ambulatory Visit | Attending: Family | Admitting: Family

## 2017-02-15 DIAGNOSIS — M545 Low back pain: Secondary | ICD-10-CM | POA: Diagnosis not present

## 2017-02-15 DIAGNOSIS — T1490XA Injury, unspecified, initial encounter: Secondary | ICD-10-CM

## 2017-02-15 DIAGNOSIS — S3992XA Unspecified injury of lower back, initial encounter: Secondary | ICD-10-CM | POA: Diagnosis not present

## 2017-02-15 DIAGNOSIS — F332 Major depressive disorder, recurrent severe without psychotic features: Secondary | ICD-10-CM | POA: Diagnosis not present

## 2017-02-16 DIAGNOSIS — W000XXD Fall on same level due to ice and snow, subsequent encounter: Secondary | ICD-10-CM | POA: Diagnosis not present

## 2017-02-16 DIAGNOSIS — M545 Low back pain: Secondary | ICD-10-CM | POA: Diagnosis not present

## 2017-02-23 DIAGNOSIS — M545 Low back pain: Secondary | ICD-10-CM | POA: Diagnosis not present

## 2017-02-23 DIAGNOSIS — Z79899 Other long term (current) drug therapy: Secondary | ICD-10-CM | POA: Diagnosis not present

## 2017-02-23 DIAGNOSIS — F332 Major depressive disorder, recurrent severe without psychotic features: Secondary | ICD-10-CM | POA: Diagnosis not present

## 2017-03-15 DIAGNOSIS — M791 Myalgia, unspecified site: Secondary | ICD-10-CM | POA: Diagnosis not present

## 2017-03-15 DIAGNOSIS — M542 Cervicalgia: Secondary | ICD-10-CM | POA: Diagnosis not present

## 2017-03-15 DIAGNOSIS — G43719 Chronic migraine without aura, intractable, without status migrainosus: Secondary | ICD-10-CM | POA: Diagnosis not present

## 2017-03-15 DIAGNOSIS — G518 Other disorders of facial nerve: Secondary | ICD-10-CM | POA: Diagnosis not present

## 2017-03-15 DIAGNOSIS — R51 Headache: Secondary | ICD-10-CM | POA: Diagnosis not present

## 2017-04-01 DIAGNOSIS — F332 Major depressive disorder, recurrent severe without psychotic features: Secondary | ICD-10-CM | POA: Diagnosis not present

## 2017-04-14 ENCOUNTER — Other Ambulatory Visit: Payer: Self-pay

## 2017-04-14 ENCOUNTER — Encounter (HOSPITAL_COMMUNITY): Payer: Self-pay

## 2017-04-14 ENCOUNTER — Emergency Department (HOSPITAL_COMMUNITY)
Admission: EM | Admit: 2017-04-14 | Discharge: 2017-04-14 | Disposition: A | Discharge status: Home / Self Care | Payer: Medicare HMO | Attending: Emergency Medicine | Admitting: Emergency Medicine

## 2017-04-14 DIAGNOSIS — R29810 Facial weakness: Secondary | ICD-10-CM | POA: Diagnosis present

## 2017-04-14 DIAGNOSIS — F17210 Nicotine dependence, cigarettes, uncomplicated: Secondary | ICD-10-CM | POA: Diagnosis not present

## 2017-04-14 DIAGNOSIS — Z79899 Other long term (current) drug therapy: Secondary | ICD-10-CM | POA: Insufficient documentation

## 2017-04-14 DIAGNOSIS — I10 Essential (primary) hypertension: Secondary | ICD-10-CM | POA: Diagnosis not present

## 2017-04-14 DIAGNOSIS — E119 Type 2 diabetes mellitus without complications: Secondary | ICD-10-CM | POA: Diagnosis not present

## 2017-04-14 DIAGNOSIS — G510 Bell's palsy: Secondary | ICD-10-CM | POA: Diagnosis not present

## 2017-04-14 MED ORDER — ACYCLOVIR 400 MG PO TABS: 400.0000 mg | ORAL_TABLET | Freq: Four times a day (QID) | ORAL | 0 refills | Status: AC

## 2017-04-14 MED ORDER — ARTIFICIAL TEARS OPHTHALMIC OINT: TOPICAL_OINTMENT | Freq: Three times a day (TID) | OPHTHALMIC | 0 refills | Status: AC

## 2017-04-14 NOTE — ED Triage Notes (Addendum)
Pt reports L-sided facial droop and slurred speech since yesterday morning at approx. 830a. No unilateral weekness. He was able to hold both arms up without drift. Ambulatory. A&Ox4. Also unable to close L eye all the way.

## 2017-04-14 NOTE — ED Notes (Signed)
Cardama MD at bedside
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
## 2017-04-19 DIAGNOSIS — I10 Essential (primary) hypertension: Secondary | ICD-10-CM | POA: Diagnosis not present

## 2017-04-19 DIAGNOSIS — G510 Bell's palsy: Secondary | ICD-10-CM | POA: Diagnosis not present

## 2017-04-19 DIAGNOSIS — E1165 Type 2 diabetes mellitus with hyperglycemia: Secondary | ICD-10-CM | POA: Diagnosis not present

## 2017-04-20 DIAGNOSIS — E669 Obesity, unspecified: Secondary | ICD-10-CM | POA: Diagnosis not present

## 2017-04-20 DIAGNOSIS — Z79899 Other long term (current) drug therapy: Secondary | ICD-10-CM | POA: Diagnosis not present

## 2017-04-20 DIAGNOSIS — E785 Hyperlipidemia, unspecified: Secondary | ICD-10-CM | POA: Diagnosis not present

## 2017-04-26 DIAGNOSIS — R590 Localized enlarged lymph nodes: Secondary | ICD-10-CM | POA: Diagnosis not present

## 2017-04-26 DIAGNOSIS — M545 Low back pain: Secondary | ICD-10-CM | POA: Diagnosis not present

## 2017-04-26 DIAGNOSIS — G510 Bell's palsy: Secondary | ICD-10-CM | POA: Diagnosis not present

## 2017-04-26 DIAGNOSIS — C770 Secondary and unspecified malignant neoplasm of lymph nodes of head, face and neck: Secondary | ICD-10-CM | POA: Diagnosis not present

## 2017-05-02 ENCOUNTER — Encounter (HOSPITAL_COMMUNITY): Payer: Self-pay | Admitting: Family Medicine

## 2017-05-02 ENCOUNTER — Emergency Department (HOSPITAL_COMMUNITY)
Admission: EM | Admit: 2017-05-02 | Discharge: 2017-05-02 | Disposition: A | Discharge status: Home / Self Care | Payer: Medicare HMO | Attending: Emergency Medicine | Admitting: Emergency Medicine

## 2017-05-02 ENCOUNTER — Emergency Department (HOSPITAL_COMMUNITY): Payer: Medicare HMO

## 2017-05-02 DIAGNOSIS — Z79899 Other long term (current) drug therapy: Secondary | ICD-10-CM | POA: Diagnosis not present

## 2017-05-02 DIAGNOSIS — Y9389 Activity, other specified: Secondary | ICD-10-CM | POA: Insufficient documentation

## 2017-05-02 DIAGNOSIS — M545 Low back pain, unspecified: Secondary | ICD-10-CM

## 2017-05-02 DIAGNOSIS — X509XXA Other and unspecified overexertion or strenuous movements or postures, initial encounter: Secondary | ICD-10-CM | POA: Diagnosis not present

## 2017-05-02 DIAGNOSIS — Y999 Unspecified external cause status: Secondary | ICD-10-CM | POA: Diagnosis not present

## 2017-05-02 DIAGNOSIS — S39012A Strain of muscle, fascia and tendon of lower back, initial encounter: Secondary | ICD-10-CM | POA: Insufficient documentation

## 2017-05-02 DIAGNOSIS — Z794 Long term (current) use of insulin: Secondary | ICD-10-CM | POA: Insufficient documentation

## 2017-05-02 DIAGNOSIS — Y929 Unspecified place or not applicable: Secondary | ICD-10-CM | POA: Insufficient documentation

## 2017-05-02 DIAGNOSIS — E1165 Type 2 diabetes mellitus with hyperglycemia: Secondary | ICD-10-CM | POA: Insufficient documentation

## 2017-05-02 DIAGNOSIS — M62830 Muscle spasm of back: Secondary | ICD-10-CM

## 2017-05-02 DIAGNOSIS — F17210 Nicotine dependence, cigarettes, uncomplicated: Secondary | ICD-10-CM | POA: Insufficient documentation

## 2017-05-02 DIAGNOSIS — I10 Essential (primary) hypertension: Secondary | ICD-10-CM | POA: Diagnosis not present

## 2017-05-02 DIAGNOSIS — S3992XA Unspecified injury of lower back, initial encounter: Secondary | ICD-10-CM | POA: Diagnosis present

## 2017-05-02 MED ORDER — CYCLOBENZAPRINE HCL 10 MG PO TABS: 10.0000 mg | ORAL_TABLET | Freq: Three times a day (TID) | ORAL | 0 refills | Status: DC | PRN

## 2017-05-02 MED ORDER — CYCLOBENZAPRINE HCL 10 MG PO TABS
10.0000 mg | ORAL_TABLET | Freq: Once | ORAL | Status: AC
  Administered 2017-05-02: 10 mg via ORAL
  Filled 2017-05-02: qty 1

## 2017-05-02 MED ORDER — NAPROXEN 500 MG PO TABS: 500.0000 mg | ORAL_TABLET | Freq: Two times a day (BID) | ORAL | 0 refills | Status: AC

## 2017-05-02 MED ORDER — NAPROXEN 500 MG PO TABS
500.0000 mg | ORAL_TABLET | Freq: Once | ORAL | Status: AC
  Administered 2017-05-02: 500 mg via ORAL
  Filled 2017-05-02: qty 1
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
## 2017-05-02 NOTE — Discharge Instructions (Signed)
Back Pain: °Your back pain should be treated with medicines such as ibuprofen or aleve and this back pain should get better over the next 2 weeks.  However if you develop severe or worsening pain, low back pain with fever, numbness, weakness or inability to walk or urinate, you should return to the ER immediately.  Please follow up with your doctor this week for a recheck if still having symptoms.  Avoid heavy lifting over 10 pounds over the next two weeks. ° °Low back pain is discomfort in the lower back that may be due to injuries to muscles and ligaments around the spine.  Occasionally, it may be caused by a a problem to a part of the spine called a disc.  The pain may last several days or a week;  However, most patients get completely well in 4 weeks. ° °Self - care:  The application of heat can help soothe the pain.  Maintaining your daily activities, including walking, is encourged, as it will help you get better faster than just staying in bed. Perform gentle stretching as discussed. Drink plenty of fluids. ° °Medications are also useful to help with pain control.  A commonly prescribed medication includes over the counter Tylenol; take as directed on the bottle.  ° °Non steroidal anti inflammatory medications including Ibuprofen and naproxen;  These medications help both pain and swelling and are very useful in treating back pain.  They should be taken with food, as they can cause stomach upset, and more seriously, stomach bleeding.   ° °Muscle relaxants:  These medications can help with muscle tightness that is a cause of lower back pain.  Most of these medications can cause drowsiness, and it is not safe to drive or use dangerous machinery while taking them. ° °SEEK IMMEDIATE MEDICAL ATTENTION IF: °New numbness, tingling, weakness, or problem with the use of your arms or legs.  °Severe back pain not relieved with medications.  °Difficulty with or loss of control of your bowel or bladder control.    °Increasing pain in any areas of the body (such as chest or abdominal pain).  °Shortness of breath, dizziness or fainting.  °Nausea (feeling sick to your stomach), vomiting, fever, or sweats. ° °You will need to follow up with  Your primary healthcare provider in 1-2 weeks for reassessment.  °

## 2017-05-02 NOTE — ED Triage Notes (Signed)
Patient is complaining of right lower back pain that started about one week ago. Patient reports he has been seen by PCP for same symptoms. Patient reports the pain has got worse since visit. He states he is waiting on orthopedic referreal from PCP. Patient denies any loss of bowel/bladder or numbness/tingling.

## 2017-05-17 DIAGNOSIS — R51 Headache: Secondary | ICD-10-CM | POA: Diagnosis not present

## 2017-05-17 DIAGNOSIS — G43719 Chronic migraine without aura, intractable, without status migrainosus: Secondary | ICD-10-CM | POA: Diagnosis not present

## 2017-05-17 DIAGNOSIS — G518 Other disorders of facial nerve: Secondary | ICD-10-CM | POA: Diagnosis not present

## 2017-05-17 DIAGNOSIS — M791 Myalgia, unspecified site: Secondary | ICD-10-CM | POA: Diagnosis not present

## 2017-05-17 DIAGNOSIS — M542 Cervicalgia: Secondary | ICD-10-CM | POA: Diagnosis not present

## 2017-06-07 ENCOUNTER — Ambulatory Visit (HOSPITAL_COMMUNITY)
Admission: RE | Admit: 2017-06-07 | Discharge: 2017-06-07 | Disposition: A | Discharge status: Home / Self Care | Payer: Medicare HMO | Source: Ambulatory Visit | Attending: Urology | Admitting: Urology

## 2017-06-07 ENCOUNTER — Other Ambulatory Visit: Payer: Self-pay | Admitting: Urology

## 2017-06-07 DIAGNOSIS — C642 Malignant neoplasm of left kidney, except renal pelvis: Secondary | ICD-10-CM

## 2017-06-10 DIAGNOSIS — C642 Malignant neoplasm of left kidney, except renal pelvis: Secondary | ICD-10-CM | POA: Diagnosis not present

## 2017-06-14 DIAGNOSIS — C642 Malignant neoplasm of left kidney, except renal pelvis: Secondary | ICD-10-CM | POA: Diagnosis not present

## 2017-07-12 DIAGNOSIS — M542 Cervicalgia: Secondary | ICD-10-CM | POA: Diagnosis not present

## 2017-07-12 DIAGNOSIS — G500 Trigeminal neuralgia: Secondary | ICD-10-CM | POA: Diagnosis not present

## 2017-07-12 DIAGNOSIS — G509 Disorder of trigeminal nerve, unspecified: Secondary | ICD-10-CM | POA: Diagnosis not present

## 2017-07-12 DIAGNOSIS — G43719 Chronic migraine without aura, intractable, without status migrainosus: Secondary | ICD-10-CM | POA: Diagnosis not present

## 2017-07-12 DIAGNOSIS — R51 Headache: Secondary | ICD-10-CM | POA: Diagnosis not present

## 2017-07-12 DIAGNOSIS — M791 Myalgia, unspecified site: Secondary | ICD-10-CM | POA: Diagnosis not present

## 2017-07-12 DIAGNOSIS — G518 Other disorders of facial nerve: Secondary | ICD-10-CM | POA: Diagnosis not present

## 2017-08-26 DIAGNOSIS — L209 Atopic dermatitis, unspecified: Secondary | ICD-10-CM | POA: Diagnosis not present

## 2017-08-26 DIAGNOSIS — I10 Essential (primary) hypertension: Secondary | ICD-10-CM | POA: Diagnosis not present

## 2017-08-26 DIAGNOSIS — E1165 Type 2 diabetes mellitus with hyperglycemia: Secondary | ICD-10-CM | POA: Diagnosis not present

## 2017-08-26 DIAGNOSIS — E785 Hyperlipidemia, unspecified: Secondary | ICD-10-CM | POA: Diagnosis not present

## 2017-09-20 DIAGNOSIS — M791 Myalgia, unspecified site: Secondary | ICD-10-CM | POA: Diagnosis not present

## 2017-09-20 DIAGNOSIS — G43719 Chronic migraine without aura, intractable, without status migrainosus: Secondary | ICD-10-CM | POA: Diagnosis not present

## 2017-09-20 DIAGNOSIS — G518 Other disorders of facial nerve: Secondary | ICD-10-CM | POA: Diagnosis not present

## 2017-09-20 DIAGNOSIS — M542 Cervicalgia: Secondary | ICD-10-CM | POA: Diagnosis not present

## 2017-09-20 DIAGNOSIS — R51 Headache: Secondary | ICD-10-CM | POA: Diagnosis not present

## 2017-10-26 DIAGNOSIS — Z79899 Other long term (current) drug therapy: Secondary | ICD-10-CM | POA: Diagnosis not present

## 2017-10-26 DIAGNOSIS — I10 Essential (primary) hypertension: Secondary | ICD-10-CM | POA: Diagnosis not present

## 2017-10-26 DIAGNOSIS — E663 Overweight: Secondary | ICD-10-CM | POA: Diagnosis not present

## 2017-10-26 DIAGNOSIS — E1165 Type 2 diabetes mellitus with hyperglycemia: Secondary | ICD-10-CM | POA: Diagnosis not present

## 2017-10-26 DIAGNOSIS — E785 Hyperlipidemia, unspecified: Secondary | ICD-10-CM | POA: Diagnosis not present

## 2017-10-26 DIAGNOSIS — F411 Generalized anxiety disorder: Secondary | ICD-10-CM | POA: Diagnosis not present

## 2017-11-22 DIAGNOSIS — G518 Other disorders of facial nerve: Secondary | ICD-10-CM | POA: Diagnosis not present

## 2017-11-22 DIAGNOSIS — M542 Cervicalgia: Secondary | ICD-10-CM | POA: Diagnosis not present

## 2017-11-22 DIAGNOSIS — R51 Headache: Secondary | ICD-10-CM | POA: Diagnosis not present

## 2017-11-22 DIAGNOSIS — G43719 Chronic migraine without aura, intractable, without status migrainosus: Secondary | ICD-10-CM | POA: Diagnosis not present

## 2017-11-22 DIAGNOSIS — M791 Myalgia, unspecified site: Secondary | ICD-10-CM | POA: Diagnosis not present

## 2017-11-25 DIAGNOSIS — Z79891 Long term (current) use of opiate analgesic: Secondary | ICD-10-CM | POA: Diagnosis not present

## 2017-11-25 DIAGNOSIS — F331 Major depressive disorder, recurrent, moderate: Secondary | ICD-10-CM | POA: Diagnosis not present

## 2017-11-25 DIAGNOSIS — F411 Generalized anxiety disorder: Secondary | ICD-10-CM | POA: Diagnosis not present

## 2017-11-25 DIAGNOSIS — F4011 Social phobia, generalized: Secondary | ICD-10-CM | POA: Diagnosis not present

## 2017-11-25 DIAGNOSIS — F4312 Post-traumatic stress disorder, chronic: Secondary | ICD-10-CM | POA: Diagnosis not present

## 2017-12-23 DIAGNOSIS — F411 Generalized anxiety disorder: Secondary | ICD-10-CM | POA: Diagnosis not present

## 2017-12-23 DIAGNOSIS — F4011 Social phobia, generalized: Secondary | ICD-10-CM | POA: Diagnosis not present

## 2017-12-23 DIAGNOSIS — F3342 Major depressive disorder, recurrent, in full remission: Secondary | ICD-10-CM | POA: Diagnosis not present

## 2017-12-23 DIAGNOSIS — F4312 Post-traumatic stress disorder, chronic: Secondary | ICD-10-CM | POA: Diagnosis not present

## 2018-01-17 DIAGNOSIS — R51 Headache: Secondary | ICD-10-CM | POA: Diagnosis not present

## 2018-01-17 DIAGNOSIS — G518 Other disorders of facial nerve: Secondary | ICD-10-CM | POA: Diagnosis not present

## 2018-01-17 DIAGNOSIS — G43719 Chronic migraine without aura, intractable, without status migrainosus: Secondary | ICD-10-CM | POA: Diagnosis not present

## 2018-01-17 DIAGNOSIS — M791 Myalgia, unspecified site: Secondary | ICD-10-CM | POA: Diagnosis not present

## 2018-01-17 DIAGNOSIS — M542 Cervicalgia: Secondary | ICD-10-CM | POA: Diagnosis not present

## 2018-03-15 DIAGNOSIS — K219 Gastro-esophageal reflux disease without esophagitis: Secondary | ICD-10-CM | POA: Diagnosis not present

## 2018-03-15 DIAGNOSIS — I10 Essential (primary) hypertension: Secondary | ICD-10-CM | POA: Diagnosis not present

## 2018-03-15 DIAGNOSIS — L209 Atopic dermatitis, unspecified: Secondary | ICD-10-CM | POA: Diagnosis not present

## 2018-03-15 DIAGNOSIS — F411 Generalized anxiety disorder: Secondary | ICD-10-CM | POA: Diagnosis not present

## 2018-03-15 DIAGNOSIS — H539 Unspecified visual disturbance: Secondary | ICD-10-CM | POA: Diagnosis not present

## 2018-03-15 DIAGNOSIS — Z0101 Encounter for examination of eyes and vision with abnormal findings: Secondary | ICD-10-CM | POA: Diagnosis not present

## 2018-03-15 DIAGNOSIS — E785 Hyperlipidemia, unspecified: Secondary | ICD-10-CM | POA: Diagnosis not present

## 2018-03-15 DIAGNOSIS — F17210 Nicotine dependence, cigarettes, uncomplicated: Secondary | ICD-10-CM | POA: Diagnosis not present

## 2018-03-15 DIAGNOSIS — R972 Elevated prostate specific antigen [PSA]: Secondary | ICD-10-CM | POA: Diagnosis not present

## 2018-03-15 DIAGNOSIS — Z79899 Other long term (current) drug therapy: Secondary | ICD-10-CM | POA: Diagnosis not present

## 2018-03-15 DIAGNOSIS — E1165 Type 2 diabetes mellitus with hyperglycemia: Secondary | ICD-10-CM | POA: Diagnosis not present

## 2018-03-16 IMAGING — CR DG LUMBAR SPINE COMPLETE 4+V
5 series · 5 of 5 positions shown · non-contrast
Comparison: Lumbar spine films of 02/15/2017

CLINICAL DATA: Low back pain for 1 week after bending over

EXAM:
LUMBAR SPINE - COMPLETE 4+ VIEW

[t lumbar spine ap]
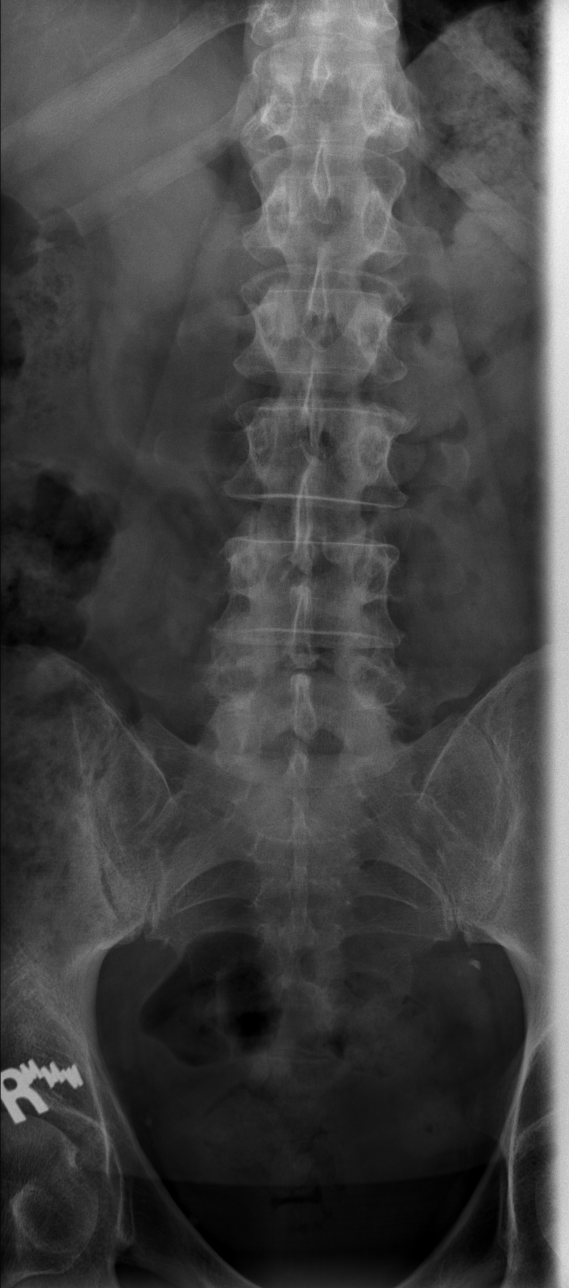

[t lumbar spine obl (1 of 2)]
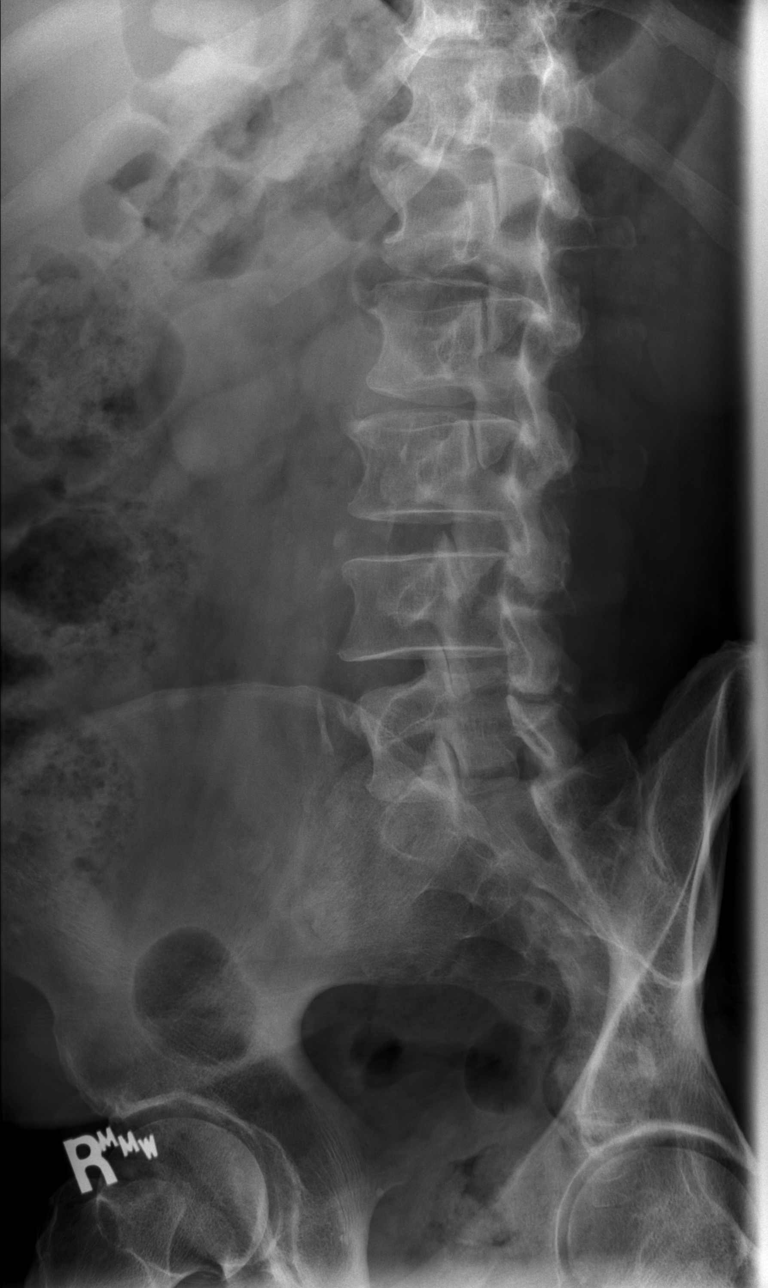

[t lumbar spine obl (2 of 2)]
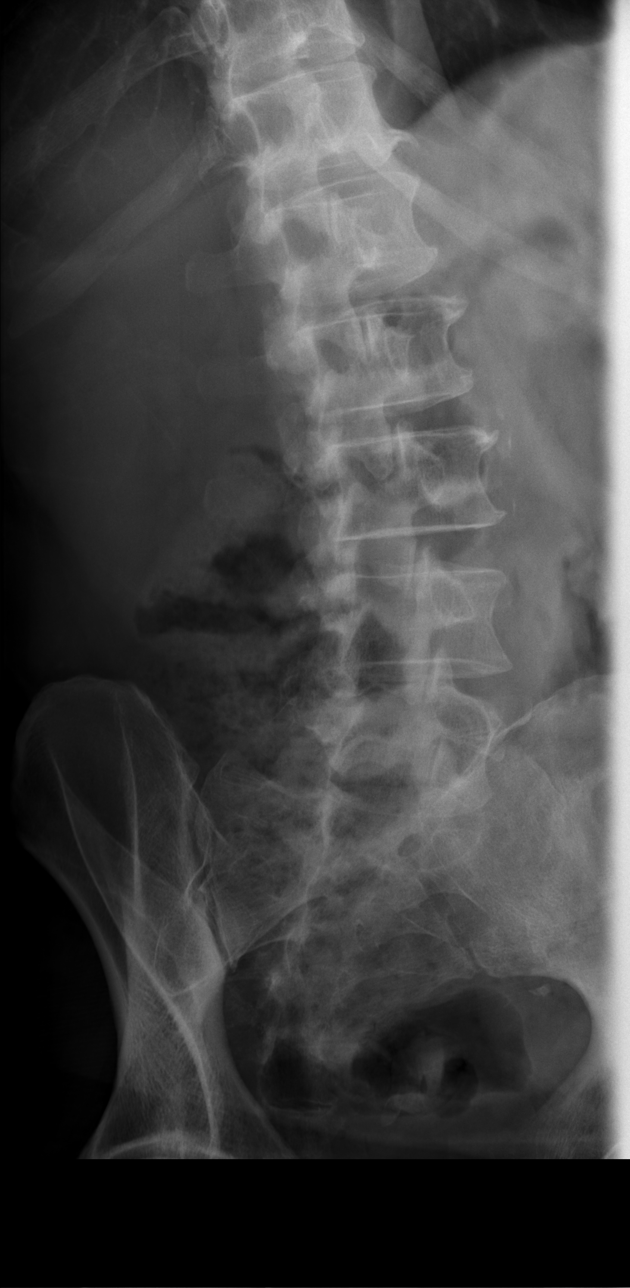

[t lumbar spine lat]
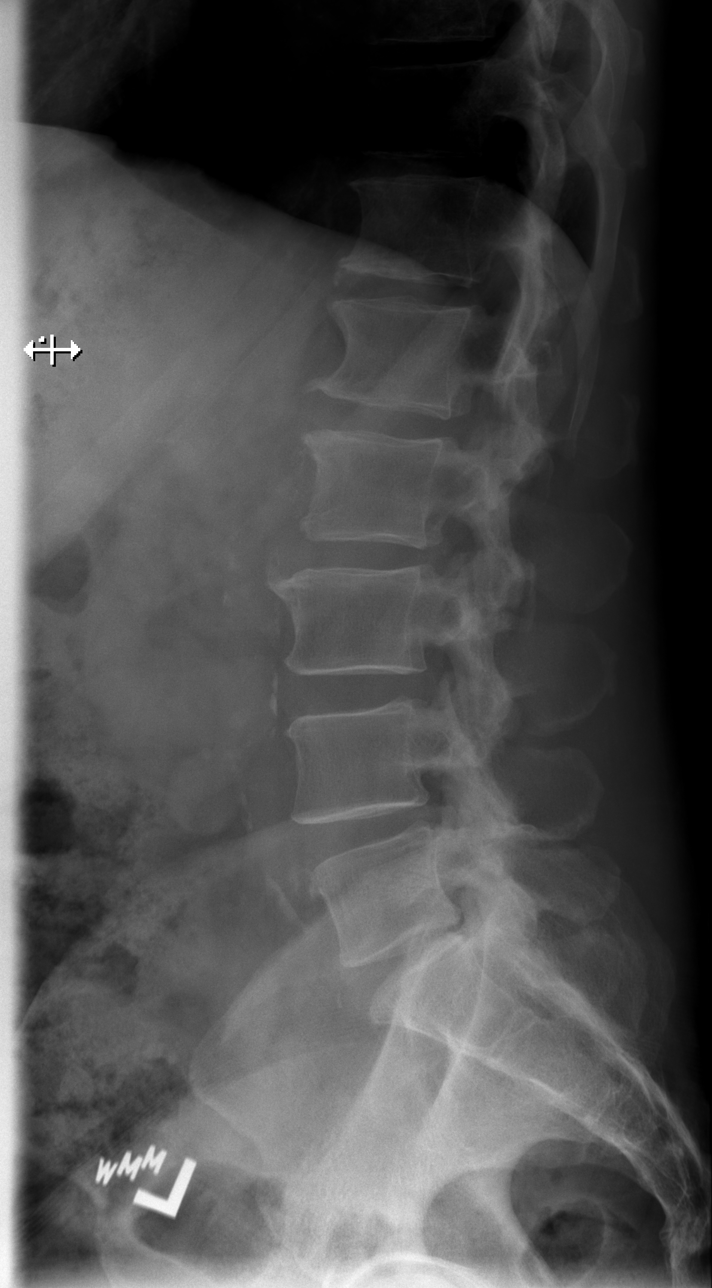

[t lumbar l-5 s-1 spot]
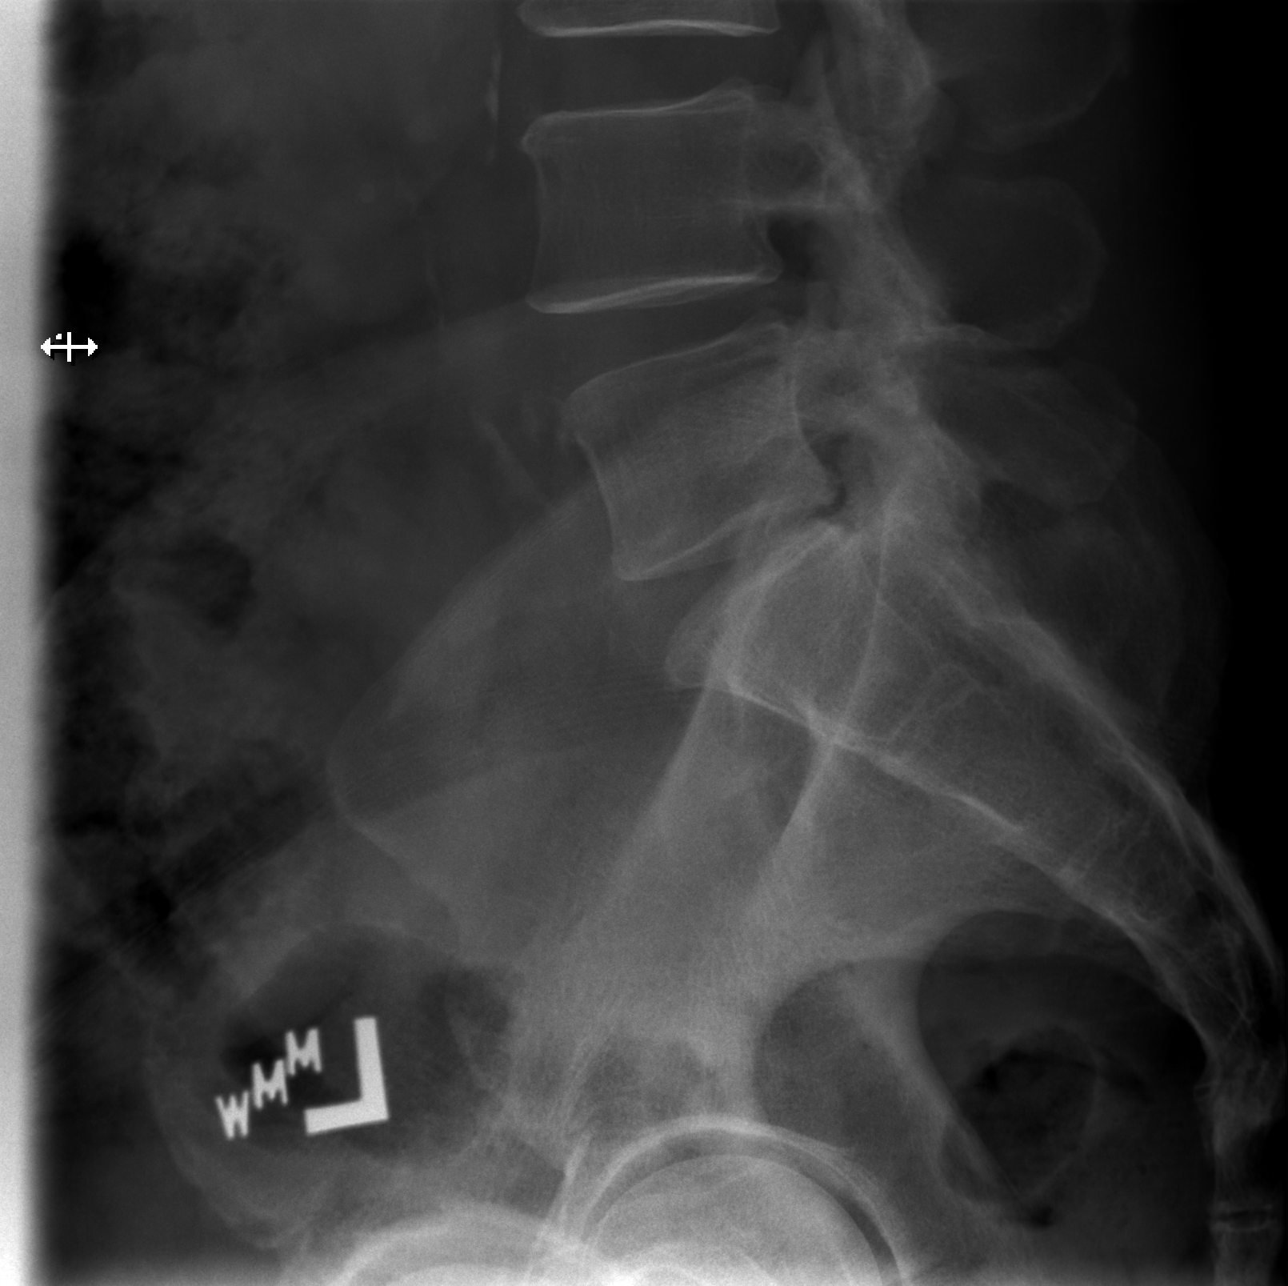

[5 of 5 positions shown; findings below may reference images not displayed]

FINDINGS: The lumbar vertebrae remain in normal alignment. Intervertebral disc
spaces appear normal. No significant degenerative changes seen other
than mild anterior osteophyte formation. The facet joints are
unremarkable. The SI joints appear corticated.
IMPRESSION: Stable lumbar spine with normal alignment and normal disc spaces. No
acute abnormality.

## 2018-03-21 DIAGNOSIS — G518 Other disorders of facial nerve: Secondary | ICD-10-CM | POA: Diagnosis not present

## 2018-03-21 DIAGNOSIS — M542 Cervicalgia: Secondary | ICD-10-CM | POA: Diagnosis not present

## 2018-03-21 DIAGNOSIS — M791 Myalgia, unspecified site: Secondary | ICD-10-CM | POA: Diagnosis not present

## 2018-03-21 DIAGNOSIS — G43719 Chronic migraine without aura, intractable, without status migrainosus: Secondary | ICD-10-CM | POA: Diagnosis not present

## 2018-04-21 IMAGING — CR DG CHEST 2V
2 series · 2 of 2 positions shown · non-contrast
Comparison: 03/19/2015.

CLINICAL DATA: LEFT-sided renal cell cancer, partial LEFT
nephrectomy, no recent chest complaints.

EXAM:
CHEST - 2 VIEW

[w chest pa]
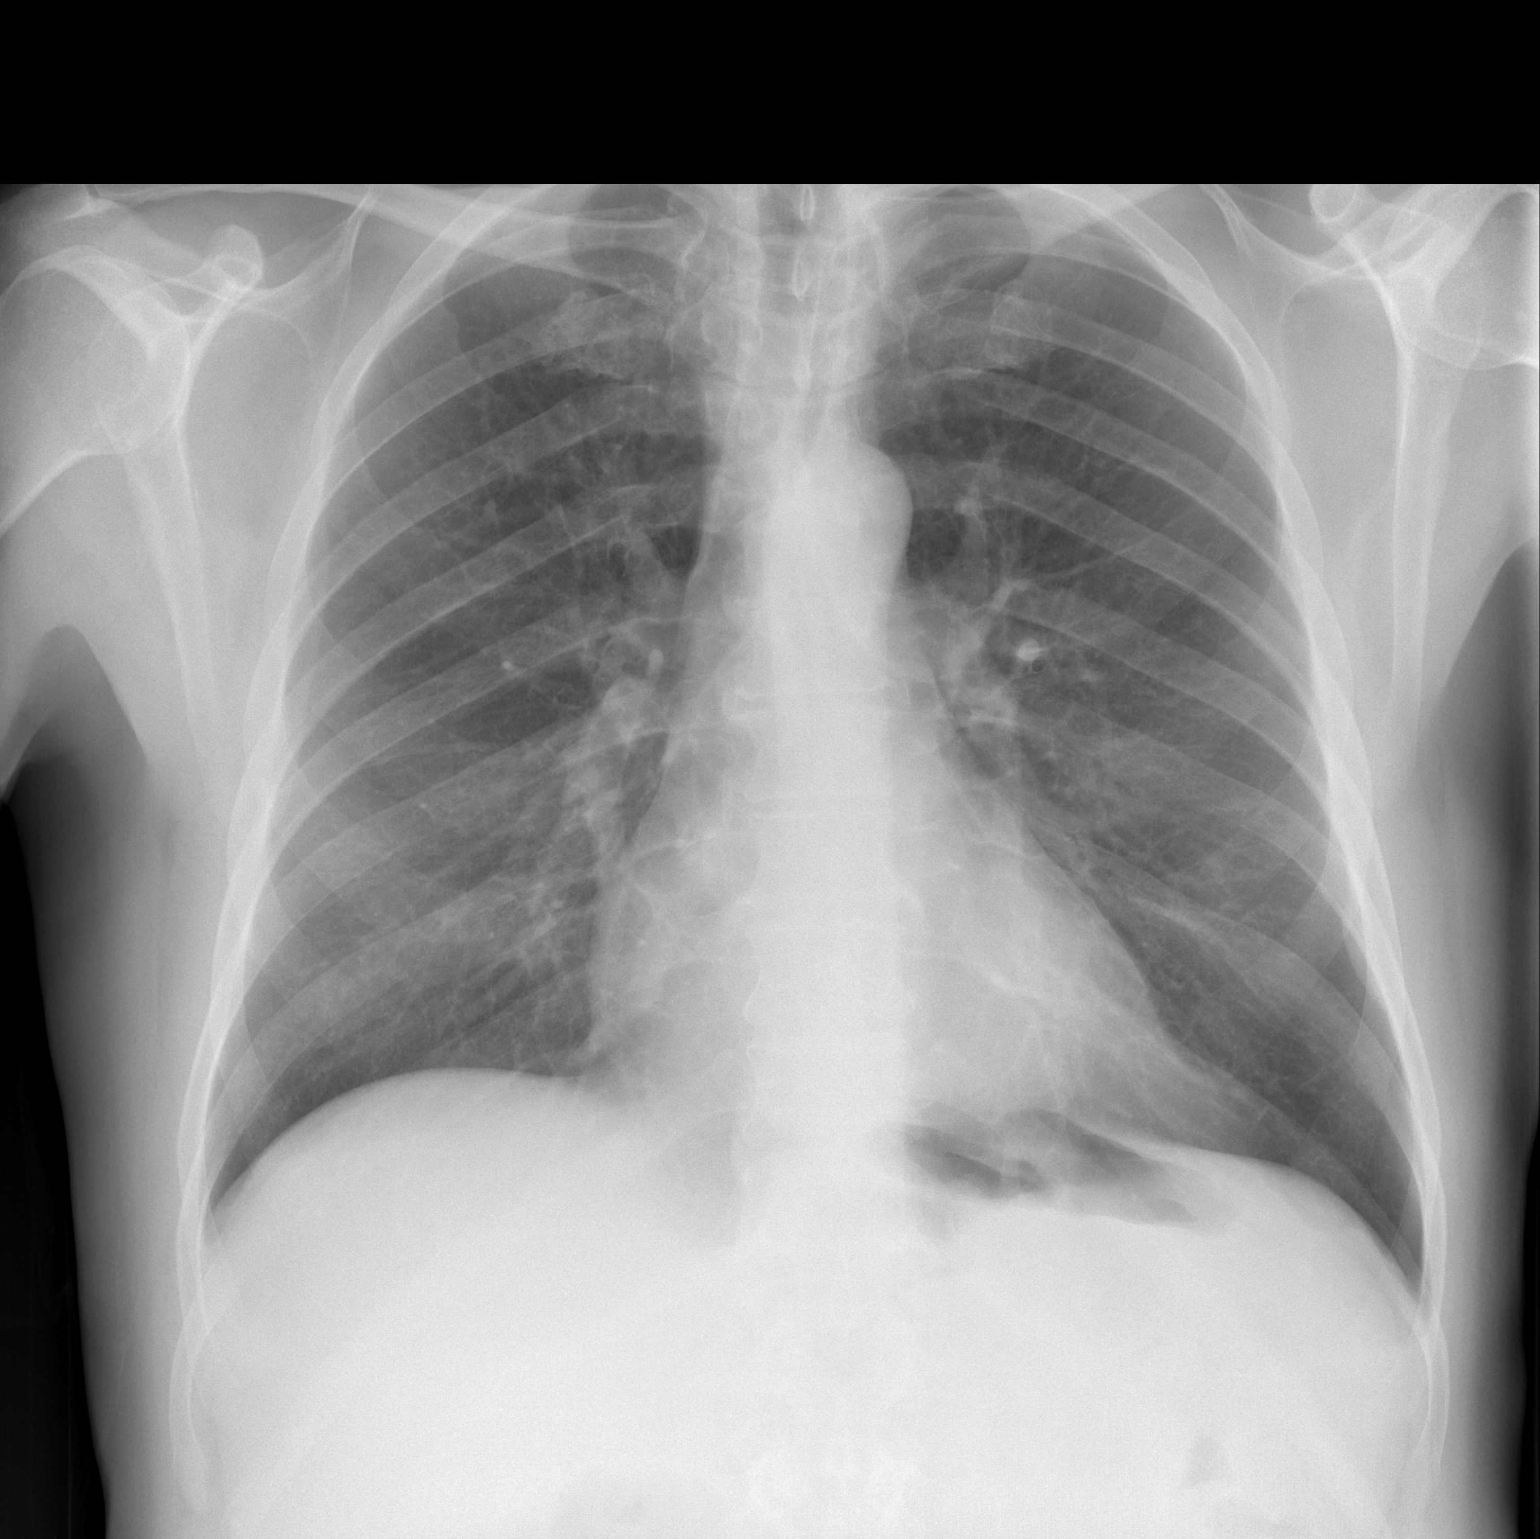

[w chest lat]
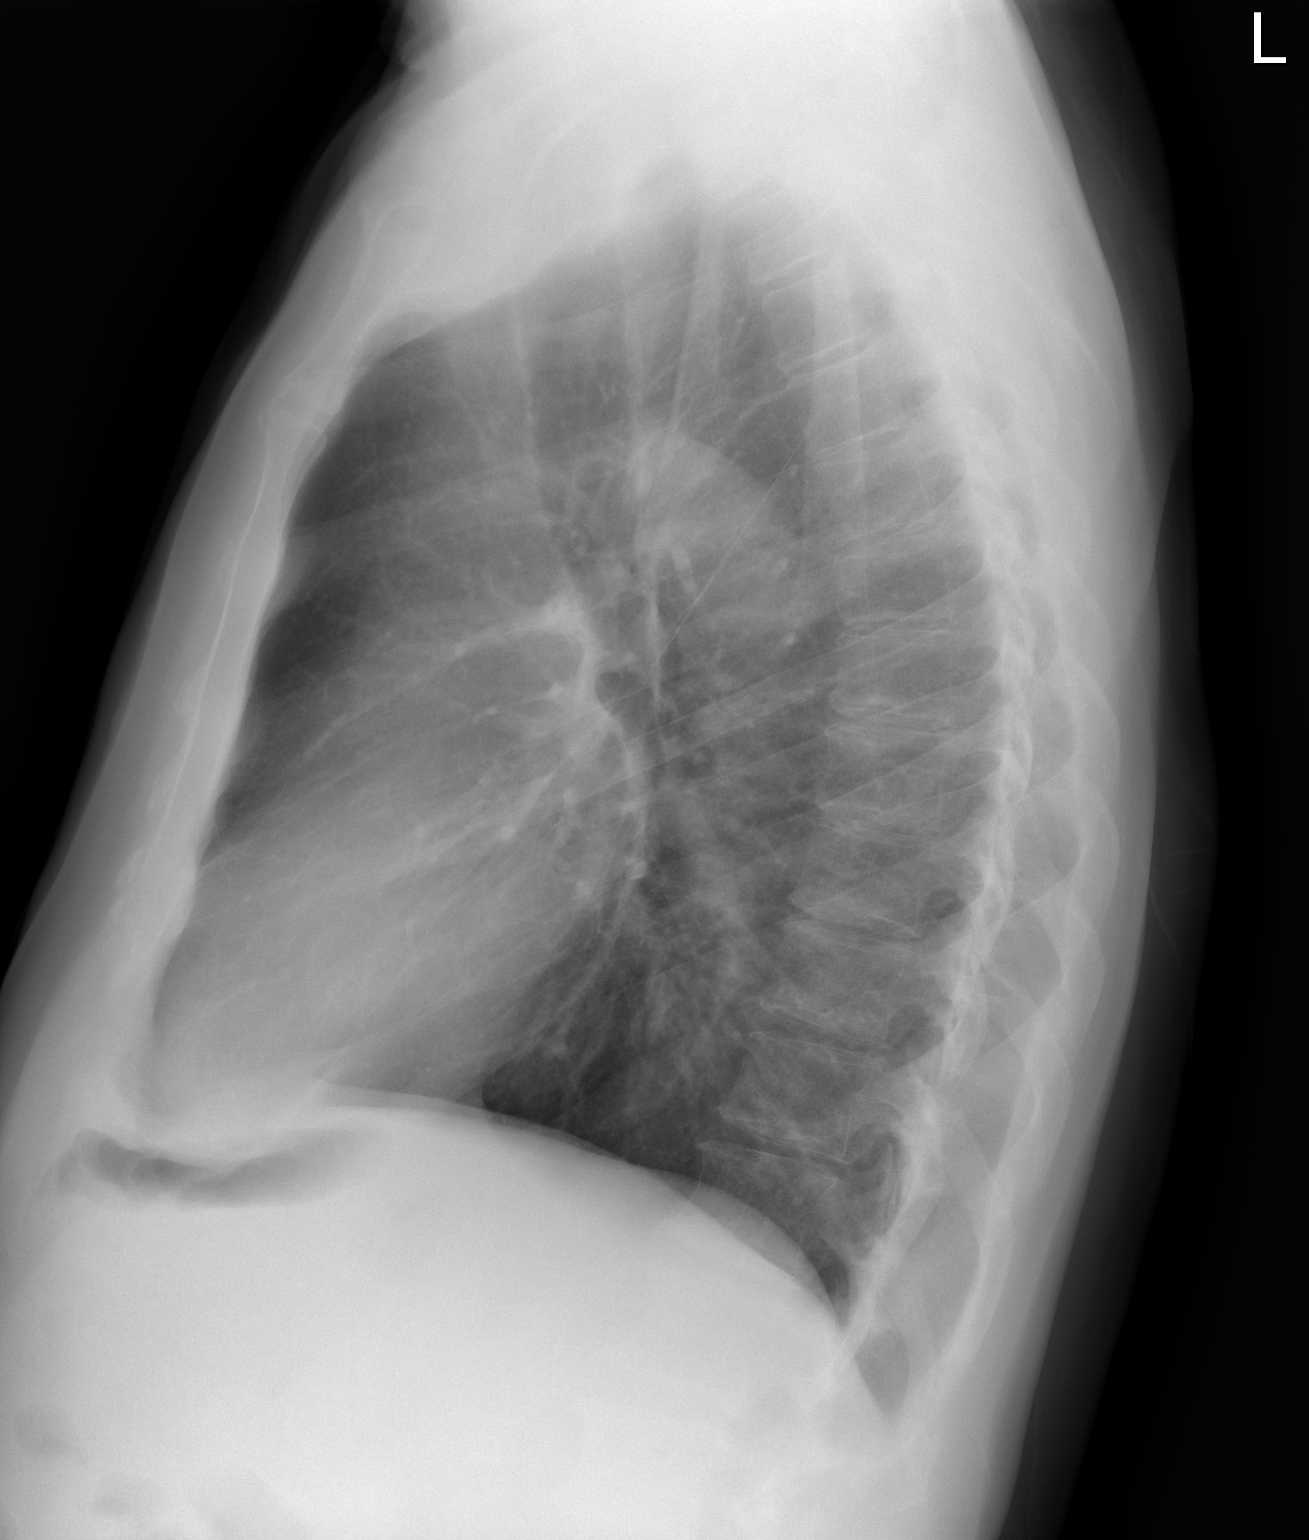

[2 of 2 positions shown; findings below may reference images not displayed]

FINDINGS: The heart size and mediastinal contours are within normal limits.
Both lungs are clear. The visualized skeletal structures are
unremarkable.
IMPRESSION: No active cardiopulmonary disease.

## 2018-04-29 ENCOUNTER — Encounter: Payer: Self-pay | Admitting: Gastroenterology

## 2018-05-08 DIAGNOSIS — F4011 Social phobia, generalized: Secondary | ICD-10-CM | POA: Diagnosis not present

## 2018-05-08 DIAGNOSIS — F411 Generalized anxiety disorder: Secondary | ICD-10-CM | POA: Diagnosis not present

## 2018-05-08 DIAGNOSIS — F331 Major depressive disorder, recurrent, moderate: Secondary | ICD-10-CM | POA: Diagnosis not present

## 2018-05-22 DIAGNOSIS — G43719 Chronic migraine without aura, intractable, without status migrainosus: Secondary | ICD-10-CM | POA: Diagnosis not present

## 2018-05-22 DIAGNOSIS — M791 Myalgia, unspecified site: Secondary | ICD-10-CM | POA: Diagnosis not present

## 2018-05-22 DIAGNOSIS — M542 Cervicalgia: Secondary | ICD-10-CM | POA: Diagnosis not present

## 2018-05-22 DIAGNOSIS — G518 Other disorders of facial nerve: Secondary | ICD-10-CM | POA: Diagnosis not present

## 2018-06-14 DIAGNOSIS — F411 Generalized anxiety disorder: Secondary | ICD-10-CM | POA: Diagnosis not present

## 2018-06-14 DIAGNOSIS — E1165 Type 2 diabetes mellitus with hyperglycemia: Secondary | ICD-10-CM | POA: Diagnosis not present

## 2018-06-14 DIAGNOSIS — I10 Essential (primary) hypertension: Secondary | ICD-10-CM | POA: Diagnosis not present

## 2018-06-14 DIAGNOSIS — E785 Hyperlipidemia, unspecified: Secondary | ICD-10-CM | POA: Diagnosis not present

## 2018-06-14 DIAGNOSIS — F17210 Nicotine dependence, cigarettes, uncomplicated: Secondary | ICD-10-CM | POA: Diagnosis not present

## 2018-06-14 DIAGNOSIS — M19012 Primary osteoarthritis, left shoulder: Secondary | ICD-10-CM | POA: Diagnosis not present

## 2018-06-19 DIAGNOSIS — F4011 Social phobia, generalized: Secondary | ICD-10-CM | POA: Diagnosis not present

## 2018-06-19 DIAGNOSIS — F331 Major depressive disorder, recurrent, moderate: Secondary | ICD-10-CM | POA: Diagnosis not present

## 2018-06-19 DIAGNOSIS — F411 Generalized anxiety disorder: Secondary | ICD-10-CM | POA: Diagnosis not present

## 2018-07-26 DIAGNOSIS — M791 Myalgia, unspecified site: Secondary | ICD-10-CM | POA: Diagnosis not present

## 2018-07-26 DIAGNOSIS — M542 Cervicalgia: Secondary | ICD-10-CM | POA: Diagnosis not present

## 2018-07-26 DIAGNOSIS — G518 Other disorders of facial nerve: Secondary | ICD-10-CM | POA: Diagnosis not present

## 2018-07-26 DIAGNOSIS — G43719 Chronic migraine without aura, intractable, without status migrainosus: Secondary | ICD-10-CM | POA: Diagnosis not present

## 2018-08-30 DIAGNOSIS — C642 Malignant neoplasm of left kidney, except renal pelvis: Secondary | ICD-10-CM | POA: Diagnosis not present

## 2018-08-30 DIAGNOSIS — Z125 Encounter for screening for malignant neoplasm of prostate: Secondary | ICD-10-CM | POA: Diagnosis not present

## 2018-10-17 DIAGNOSIS — F17210 Nicotine dependence, cigarettes, uncomplicated: Secondary | ICD-10-CM | POA: Diagnosis not present

## 2018-10-17 DIAGNOSIS — J301 Allergic rhinitis due to pollen: Secondary | ICD-10-CM | POA: Diagnosis not present

## 2018-10-17 DIAGNOSIS — F411 Generalized anxiety disorder: Secondary | ICD-10-CM | POA: Diagnosis not present

## 2018-10-17 DIAGNOSIS — J4520 Mild intermittent asthma, uncomplicated: Secondary | ICD-10-CM | POA: Diagnosis not present

## 2018-10-17 DIAGNOSIS — Z20828 Contact with and (suspected) exposure to other viral communicable diseases: Secondary | ICD-10-CM | POA: Diagnosis not present

## 2018-10-17 DIAGNOSIS — I10 Essential (primary) hypertension: Secondary | ICD-10-CM | POA: Diagnosis not present

## 2018-10-17 DIAGNOSIS — E1165 Type 2 diabetes mellitus with hyperglycemia: Secondary | ICD-10-CM | POA: Diagnosis not present

## 2018-10-17 DIAGNOSIS — E785 Hyperlipidemia, unspecified: Secondary | ICD-10-CM | POA: Diagnosis not present

## 2018-10-19 DIAGNOSIS — F4011 Social phobia, generalized: Secondary | ICD-10-CM | POA: Diagnosis not present

## 2018-10-19 DIAGNOSIS — F3342 Major depressive disorder, recurrent, in full remission: Secondary | ICD-10-CM | POA: Diagnosis not present

## 2018-10-19 DIAGNOSIS — F411 Generalized anxiety disorder: Secondary | ICD-10-CM | POA: Diagnosis not present

## 2018-11-17 DIAGNOSIS — J4520 Mild intermittent asthma, uncomplicated: Secondary | ICD-10-CM | POA: Diagnosis not present

## 2018-12-17 DIAGNOSIS — J4520 Mild intermittent asthma, uncomplicated: Secondary | ICD-10-CM | POA: Diagnosis not present

## 2021-02-19 ENCOUNTER — Other Ambulatory Visit: Payer: Self-pay | Admitting: Nurse Practitioner

## 2021-02-19 DIAGNOSIS — R161 Splenomegaly, not elsewhere classified: Secondary | ICD-10-CM

## 2021-03-12 ENCOUNTER — Ambulatory Visit
Admission: RE | Admit: 2021-03-12 | Discharge: 2021-03-12 | Disposition: A | Discharge status: Home / Self Care | Payer: Medicare Other | Source: Ambulatory Visit | Attending: Nurse Practitioner | Admitting: Nurse Practitioner

## 2021-03-12 DIAGNOSIS — R161 Splenomegaly, not elsewhere classified: Secondary | ICD-10-CM

## 2021-05-05 ENCOUNTER — Emergency Department (HOSPITAL_COMMUNITY): Payer: Medicare Other

## 2021-05-05 ENCOUNTER — Emergency Department (HOSPITAL_COMMUNITY)
Admission: EM | Admit: 2021-05-05 | Discharge: 2021-05-05 | Disposition: A | Discharge status: Home / Self Care | Payer: Medicare Other | Attending: Emergency Medicine | Admitting: Emergency Medicine

## 2021-05-05 ENCOUNTER — Telehealth: Payer: Self-pay | Admitting: Gastroenterology

## 2021-05-05 DIAGNOSIS — Z7984 Long term (current) use of oral hypoglycemic drugs: Secondary | ICD-10-CM | POA: Insufficient documentation

## 2021-05-05 DIAGNOSIS — E119 Type 2 diabetes mellitus without complications: Secondary | ICD-10-CM | POA: Insufficient documentation

## 2021-05-05 DIAGNOSIS — I10 Essential (primary) hypertension: Secondary | ICD-10-CM | POA: Insufficient documentation

## 2021-05-05 DIAGNOSIS — Z79899 Other long term (current) drug therapy: Secondary | ICD-10-CM | POA: Insufficient documentation

## 2021-05-05 DIAGNOSIS — K2900 Acute gastritis without bleeding: Secondary | ICD-10-CM | POA: Insufficient documentation

## 2021-05-05 DIAGNOSIS — R1011 Right upper quadrant pain: Secondary | ICD-10-CM | POA: Diagnosis present

## 2021-05-05 DIAGNOSIS — Z794 Long term (current) use of insulin: Secondary | ICD-10-CM | POA: Diagnosis not present

## 2021-05-05 DIAGNOSIS — R1013 Epigastric pain: Secondary | ICD-10-CM

## 2021-05-05 LAB — LIPASE, BLOOD: Lipase: 25 U/L (ref 11–51)

## 2021-05-05 LAB — CBC WITH DIFFERENTIAL/PLATELET
Abs Immature Granulocytes: 0.03 10*3/uL (ref 0.00–0.07)
Basophils Absolute: 0.1 10*3/uL (ref 0.0–0.1)
Basophils Relative: 1 %
Eosinophils Absolute: 0.3 10*3/uL (ref 0.0–0.5)
Eosinophils Relative: 3 %
HCT: 45.3 % (ref 39.0–52.0)
Hemoglobin: 15.2 g/dL (ref 13.0–17.0)
Immature Granulocytes: 0 %
Lymphocytes Relative: 28 %
Lymphs Abs: 2.2 10*3/uL (ref 0.7–4.0)
MCH: 29.1 pg (ref 26.0–34.0)
MCHC: 33.6 g/dL (ref 30.0–36.0)
MCV: 86.6 fL (ref 80.0–100.0)
Monocytes Absolute: 0.7 10*3/uL (ref 0.1–1.0)
Monocytes Relative: 9 %
Neutro Abs: 4.7 10*3/uL (ref 1.7–7.7)
Neutrophils Relative %: 59 %
Platelets: 207 10*3/uL (ref 150–400)
RBC: 5.23 MIL/uL (ref 4.22–5.81)
RDW: 13 % (ref 11.5–15.5)
WBC: 8 10*3/uL (ref 4.0–10.5)
nRBC: 0 % (ref 0.0–0.2)

## 2021-05-05 LAB — COMPREHENSIVE METABOLIC PANEL
ALT: 19 U/L (ref 0–44)
AST: 15 U/L (ref 15–41)
Albumin: 3.9 g/dL (ref 3.5–5.0)
Alkaline Phosphatase: 111 U/L (ref 38–126)
Anion gap: 3 — ABNORMAL LOW (ref 5–15)
BUN: 7 mg/dL — ABNORMAL LOW (ref 8–23)
CO2: 29 mmol/L (ref 22–32)
Calcium: 8.7 mg/dL — ABNORMAL LOW (ref 8.9–10.3)
Chloride: 102 mmol/L (ref 98–111)
Creatinine, Ser: 0.71 mg/dL (ref 0.61–1.24)
GFR, Estimated: >60 mL/min (ref >60)
Glucose, Bld: 156 mg/dL — ABNORMAL HIGH (ref 70–99)
Potassium: 4 mmol/L (ref 3.5–5.1)
Sodium: 134 mmol/L — ABNORMAL LOW (ref 135–145)
Total Bilirubin: 0.5 mg/dL (ref 0.3–1.2)
Total Protein: 7 g/dL (ref 6.5–8.1)

## 2021-05-05 LAB — URINALYSIS, ROUTINE W REFLEX MICROSCOPIC
Bilirubin Urine: NEGATIVE
Glucose, UA: NEGATIVE mg/dL
Hgb urine dipstick: NEGATIVE
Ketones, ur: NEGATIVE mg/dL
Leukocytes,Ua: NEGATIVE
Nitrite: NEGATIVE
Protein, ur: NEGATIVE mg/dL
Specific Gravity, Urine: 1.009 (ref 1.005–1.030)
pH: 8 (ref 5.0–8.0)

## 2021-05-05 LAB — LACTIC ACID, PLASMA: Lactic Acid, Venous: 1.1 mmol/L (ref 0.5–1.9)

## 2021-05-05 LAB — PROTIME-INR
INR: 1 (ref 0.8–1.2)
Prothrombin Time: 13.5 seconds (ref 11.4–15.2)

## 2021-05-05 MED ORDER — PANTOPRAZOLE SODIUM 40 MG IV SOLR
40.0000 mg | Freq: Once | INTRAVENOUS | Status: AC
  Administered 2021-05-05: 40 mg via INTRAVENOUS
  Filled 2021-05-05: qty 10

## 2021-05-05 MED ORDER — PANTOPRAZOLE SODIUM 40 MG PO TBEC: 40.0000 mg | DELAYED_RELEASE_TABLET | Freq: Every day | ORAL | 0 refills | Status: AC

## 2021-05-05 MED ORDER — IOHEXOL 300 MG/ML  SOLN
100.0000 mL | Freq: Once | INTRAMUSCULAR | Status: AC | PRN
  Administered 2021-05-05: 100 mL via INTRAVENOUS

## 2021-05-05 MED ORDER — MORPHINE SULFATE (PF) 4 MG/ML IV SOLN
4.0000 mg | Freq: Once | INTRAVENOUS | Status: AC
  Administered 2021-05-05: 4 mg via INTRAVENOUS
  Filled 2021-05-05: qty 1

## 2021-05-05 MED ORDER — SUCRALFATE 1 G PO TABS
1.0000 g | ORAL_TABLET | Freq: Once | ORAL | Status: AC
  Administered 2021-05-05: 1 g via ORAL
  Filled 2021-05-05: qty 1

## 2021-05-05 MED ORDER — ONDANSETRON HCL 4 MG/2ML IJ SOLN
4.0000 mg | Freq: Once | INTRAMUSCULAR | Status: AC
  Administered 2021-05-05: 4 mg via INTRAVENOUS
  Filled 2021-05-05: qty 2

## 2021-05-05 MED ORDER — HYDROCODONE-ACETAMINOPHEN 5-325 MG PO TABS
1.0000 | ORAL_TABLET | Freq: Once | ORAL | Status: AC
  Administered 2021-05-05: 1 via ORAL
  Filled 2021-05-05: qty 1

## 2021-05-05 MED ORDER — SODIUM CHLORIDE 0.9 % IV BOLUS
1000.0000 mL | Freq: Once | INTRAVENOUS | Status: AC
  Administered 2021-05-05: 1000 mL via INTRAVENOUS

## 2021-05-05 NOTE — Telephone Encounter (Signed)
Called by the emergency department. ?Patient has been having abdominal discomfort and nausea and vomiting. ?Ended up getting a CT scan with findings on CT that are longstanding but suggestive of potential gastritis. ?He has previously been evaluated by Dr. Fuller Plan for colonoscopies and is seen NP Chester Holstein in the past. ?I am forwarding this to Dr. Silvio Pate team so that they can work on getting him scheduled for clinic visit +/- EGD versus direct EGD depending on review of the patient's medical history. ?The patient is being discharged from the emergency department due to clinical stability. ? ? ?Justice Britain, MD ?[facility] ?Advanced Endoscopy ?Office # [phone] ?

## 2021-05-05 NOTE — ED Provider Notes (Signed)
[facility] DEPT Provider Note   CSN: [id number] Arrival date & time: 05/05/21  1506     History  Chief Complaint  Patient presents with   Abdominal Pain    Curtis Kim is a 66 y.o. male.  HPI    66 year old male comes in with chief complaint of abdominal pain. Patient has history of diabetes, hypertension, hyperlipidemia and chronic cholelithiasis.  He has been having right-sided abdominal pain for the last 2 days.  Pain is located over the right upper quadrant, right flank region and it is constant.  There is no specific evoking, aggravating or relieving factors.  Specifically patient denies any new cough, pain with inspiration.  Patient is having nausea without any emesis.  His last BM was 2 days ago.  He has not had any abdominal surgeries.  Home Medications Prior to Admission medications   Medication Sig Start Date End Date Taking? Authorizing Provider  pantoprazole (PROTONIX) 40 MG tablet Take 1 tablet (40 mg total) by mouth daily. 05/05/21  Yes Varney Biles, MD  albuterol (PROVENTIL HFA;VENTOLIN HFA) 108 (90 Base) MCG/ACT inhaler Inhale 2 puffs into the lungs every 4 (four) hours as needed for wheezing or shortness of breath.    [provider]  ARIPiprazole (ABILIFY) 5 MG tablet Take 5 mg by mouth at bedtime.    [provider]  artificial tears (LACRILUBE) OINT ophthalmic ointment Place into the left eye 3 (three) times daily. 04/14/17   Fatima Blank, MD  baclofen (LIORESAL) 10 MG tablet Take 10 mg by mouth 2 (two) times daily as needed (for headaches).     [provider]  clonazePAM (KLONOPIN) 1 MG tablet Take 1 mg by mouth 2 (two) times daily as needed for anxiety.    [provider]  cyclobenzaprine (FLEXERIL) 10 MG tablet Take 10 mg by mouth 2 (two) times daily as needed for muscle spasms. 08/25/16   [provider]  cyclobenzaprine (FLEXERIL) 10 MG tablet Take 1 tablet (10 mg total)  by mouth 3 (three) times daily as needed for muscle spasms. 05/02/17   Street, [place], PA-C  diltiazem 2 % GEL Apply 1 application topically 3 (three) times daily. Patient taking differently: Apply 1 application topically daily as needed (use after bowel movement; for anal fissures). Apply to anal fissures 07/28/16   Willia Craze, NP  docusate sodium (COLACE) 100 MG capsule Take 1 capsule (100 mg total) by mouth every 12 (twelve) hours. 06/01/16   Recardo Evangelist, PA-C  hydrOXYzine (ATARAX/VISTARIL) 25 MG tablet Take 25 mg by mouth every 6 (six) hours as needed for itching.    [provider]  imipramine (TOFRANIL) 25 MG tablet Take 50 mg by mouth daily as needed (for headaches).     [provider]  Insulin Glargine (LANTUS) 100 UNIT/ML Solostar Pen Inject 10 Units into the skin daily at 10 pm. 09/09/16   Oswald Hillock, MD  lisinopril-hydrochlorothiazide (PRINZIDE,ZESTORETIC) 10-12.5 MG per tablet Take 1 tablet by mouth 2 (two) times daily.    [provider]  loratadine (CLARITIN) 10 MG tablet Take 10 mg by mouth daily.    [provider]  metFORMIN (GLUCOPHAGE) 500 MG tablet Take 1 tablet (500 mg total) by mouth 2 (two) times daily with a meal. 09/09/16 09/09/17  Oswald Hillock, MD  naproxen (NAPROSYN) 500 MG tablet Take 1 tablet (500 mg total) by mouth 2 (two) times daily with a meal. 05/02/17   Street, [place], [place]  rosuvastatin (CRESTOR) 20 MG tablet Take 20 mg by mouth daily.    [provider]  sertraline (ZOLOFT) 100 MG tablet Take 100 mg by mouth daily as needed (for depression).  09/05/16   [provider]  triamcinolone (KENALOG) 0.025 % ointment Apply 1 application topically 2 (two) times daily as needed. To chest and back 08/25/16   [provider]  UNABLE TO FIND Glucometer #1 Insulin pen needles # 1 box Glucose test strips #1 box Lancets #1 box 09/09/16   Oswald Hillock, MD      Allergies    Patient has no known  allergies.    Review of Systems   Review of Systems  All other systems reviewed and are negative.  Physical Exam Updated Vital Signs BP (!) 182/95    Pulse 86    Temp 98.1 F (36.7 C) (Oral)    Resp 18    Ht '5\' 8"'$  (1.727 m)    Wt 75.8 kg    SpO2 99%    BMI 25.39 kg/m  Physical Exam Vitals and nursing note reviewed.  Constitutional:      Appearance: He is well-developed.  HENT:     Head: Atraumatic.  Cardiovascular:     Rate and Rhythm: Normal rate.  Pulmonary:     Effort: Pulmonary effort is normal.  Abdominal:     Tenderness: There is abdominal tenderness in the right upper quadrant and epigastric area. There is no guarding or rebound.  Musculoskeletal:     Cervical back: Neck supple.  Skin:    General: Skin is warm.  Neurological:     Mental Status: He is alert and oriented to person, place, and time.    ED Results / Procedures / Treatments   Labs (all labs ordered are listed, but only abnormal results are displayed) Labs Reviewed  COMPREHENSIVE METABOLIC PANEL - Abnormal; Notable for the following components:      Result Value   Sodium 134 (*)    Glucose, Bld 156 (*)    BUN 7 (*)    Calcium 8.7 (*)    Anion gap 3 (*)    All other components within normal limits  CBC WITH DIFFERENTIAL/PLATELET  PROTIME-INR  LIPASE, BLOOD  LACTIC ACID, PLASMA  URINALYSIS, ROUTINE W REFLEX MICROSCOPIC  LACTIC ACID, PLASMA    EKG None  Radiology CT ABDOMEN PELVIS W CONTRAST  Result Date: 05/05/2021 CLINICAL DATA:  Nausea, vomiting and abdominal pain. Previous abnormal MRCP. Duration of symptoms 2 days. EXAM: CT ABDOMEN AND PELVIS WITH CONTRAST TECHNIQUE: Multidetector CT imaging of the abdomen and pelvis was performed using the standard protocol following bolus administration of intravenous contrast. RADIATION DOSE REDUCTION: This exam was performed according to the departmental dose-optimization program which includes automated exposure control, adjustment of the mA and/or kV  according to patient size and/or use of iterative reconstruction technique. CONTRAST:  161m OMNIPAQUE IOHEXOL 300 MG/ML  SOLN COMPARISON:  Ultrasound earlier same day.  MRI 09/07/2016. FINDINGS: Lower chest: Mild scarring noted at the right lung base. Hepatobiliary: Liver parenchyma is normal. Numerous small stones dependent in the gallbladder. No CT evidence of cholecystitis or obstruction. No visible passing stone. Pancreas: Normal Spleen: Normal Adrenals/Urinary Tract: Adrenal glands are normal. Right kidney is normal. Previous resection at the upper pole the left kidney. No sign of complicating feature. Bladder is normal. Stomach/Bowel: Small hiatal hernia. Question wall thickening of the stomach which could go along with hypertrophic gastritis. This is a chronic finding present on all  prior cross-sectional imaging. The small bowel is normal. I do not see an appendix either normal or abnormal. The colon appears normal. No evidence of diverticulosis or diverticulitis. Vascular/Lymphatic: There is aortic atherosclerosis but no aneurysm. IVC is normal. No adenopathy. Reproductive: Normal Other: There is a right inguinal hernia containing only fat. Musculoskeletal: Ordinary lower lumbar degenerative changes with potential for spinal stenosis at L4-5. IMPRESSION: Chololithiasis without CT evidence of cholecystitis, obstruction or choledocholithiasis. Chronic thick-walled stomach which could be due to a hypertrophic gastritis. This has been present on prior cross-sectional imaging studies as distant as July 2018. no other abnormal intestinal finding. Previous partial nephrectomy upper pole left without complicating feature. Aortic Atherosclerosis (ICD10-I70.0). Right inguinal hernia containing only fat. Lumbar degenerative changes with possible spinal stenosis at L4-5. Electronically Signed   By: Nelson Chimes M.D.   On: 05/05/2021 17:35   US Abdomen Limited RUQ (LIVER/GB)  Result Date: 05/05/2021 CLINICAL DATA:   Pain right upper quadrant EXAM: ULTRASOUND ABDOMEN LIMITED RIGHT UPPER QUADRANT COMPARISON:  03/12/2021 FINDINGS: Gallbladder: There are multiple gallbladder stones. There is no wall thickening in gallbladder. Technologist did not observe any focal tenderness over the gallbladder. There is no fluid around the gallbladder. Common bile duct: Diameter: 7.3 mm Liver: No focal lesion identified. Within normal limits in parenchymal echogenicity. Portal vein is patent on color Doppler imaging with normal direction of blood flow towards the liver. Other: None. IMPRESSION: Gallbladder stones.  There are no signs of acute cholecystitis. Electronically Signed   By: Elmer Picker M.D.   On: 05/05/2021 16:13    Procedures Procedures    Medications Ordered in ED Medications  morphine (PF) 4 MG/ML injection 4 mg (4 mg Intravenous Given 05/05/21 1544)  ondansetron (ZOFRAN) injection 4 mg (4 mg Intravenous Given 05/05/21 1541)  sodium chloride 0.9 % bolus 1,000 mL (0 mLs Intravenous Stopped 05/05/21 1836)  iohexol (OMNIPAQUE) 300 MG/ML solution 100 mL (100 mLs Intravenous Contrast Given 05/05/21 1705)  HYDROcodone-acetaminophen (NORCO/VICODIN) 5-325 MG per tablet 1 tablet (1 tablet Oral Given 05/05/21 1835)  pantoprazole (PROTONIX) injection 40 mg (40 mg Intravenous Given 05/05/21 1835)  sucralfate (CARAFATE) tablet 1 g (1 g Oral Given 05/05/21 1835)    ED Course/ Medical Decision Making/ A&P Clinical Course as of 05/05/21 1848  Tue May 05, 2021  1706 US Abdomen Limited RUQ (LIVER/GB) Ultrasound results reviewed.  Negative for acute cholecystitis.  CT scan ordered. [AN]  1706 CBC with Differential CBC shows no elevated white count, lactic acid is normal, metabolic profile is completely normal as well.  Patient reassessed.  Continues to have abdominal pain.  My suspicion for acute peritoneal finding is low, but upon reviewing patient's prior MRCP there is discussion on a concerning mass that could be neoplasm.   Therefore best next step would be to get a CT scan for more clarity. [AN]  1848 CT ABDOMEN PELVIS W CONTRAST CT scan is reassuring, questionable gastritis.  Reviewed patient's records.  It appears that he has had colonoscopy in the past but no upper endoscopy.  Abdominal exam is unchanged.  We will start him on Protonix and advised him to follow-up with GI. [AN]    Clinical Course User Index [AN] Varney Biles, MD                           Medical Decision Making Amount and/or Complexity of Data Reviewed Labs: ordered. Decision-making details documented in ED Course. Radiology: ordered. Decision-making details documented  in ED Course.  Risk Prescription drug management.  This patient presents to the ED with chief complaint(s) of abdominal pain for the last 2 days with pertinent past medical history of chronic cholelithiasis, diabetes which further complicates the presenting complaint. The complaint involves an extensive differential diagnosis and treatment options and also carries with it a high risk of complications and morbidity.    The differential diagnosis includes :  Acute cholecystitis, symptomatic cholelithiasis, pancreatitis, cancer/tumor, SBO, nephrolithiasis.  The initial plan is to order right upper quadrant ultrasound and to treat patient with iv morphine for pain control.  Additional Tests and treatment considered: Ct abdomen and pelvis.  However, given the known history of chronic cholelithiasis, patient's pain being right-sided and epigastric and right flank region, will start with ultrasound, while patient awaits basic work-up.  Additional history obtained: Records reviewed  previous CT scans, MRCP and colonoscopy  Reassessment and review (also see workup area):  Imaging Studies: I ordered and independently visualized and interpreted the following imaging CT scan     which showed no evidence of perforated viscus or pancreatitis. The interpretation of the imaging  was limited to assessing for emergent pathology, for which purpose it was ordered.  Medicines ordered and prescription drug management: I ordered the following medications IV pain medication for pain control    Reevaluation of the patient after these medicines showed that the patient    improved    Complexity of problems addressed: Patients presentation is most consistent with  acute illness/injury with systemic symptoms During patient's assessment  Disposition: After consideration of the diagnostic results and the patients response to treatment,  I feel that the patent would benefit from discharge   .    Final Clinical Impression(s) / ED Diagnoses Final diagnoses:  Acute gastritis without hemorrhage, unspecified gastritis type  Epigastric abdominal pain    Rx / DC Orders ED Discharge Orders          Ordered    pantoprazole (PROTONIX) 40 MG tablet  Daily        05/05/21 1847              Varney Biles, MD 05/05/21 1850

## 2021-05-05 NOTE — ED Triage Notes (Signed)
Ems brings pt in from home for abdominal pain. States he also has nausea and vomiting.  ?

## 2021-05-05 NOTE — Discharge Instructions (Addendum)
You are seen in the ER for abdominal pain.  Possibly you have gastritis.  Start taking Protonix in place of omeprazole as prescribed. ? ?Please call GI doctor as requested for an appointment. ?Please return to the ER if your symptoms worsen; you have increased pain, fevers, chills, inability to keep any medications down, confusion. Otherwise see the outpatient doctor as requested. ? ?

## 2021-05-06 NOTE — Telephone Encounter (Signed)
Please schedule an office appt with me or an APP.  ?

## 2021-05-06 NOTE — Telephone Encounter (Signed)
Unable to leave message for patient, no voicemail set up.  ?

## 2021-05-07 ENCOUNTER — Encounter: Payer: Self-pay | Admitting: Gastroenterology

## 2021-05-07 NOTE — Telephone Encounter (Signed)
Unable to leave message for patient, no voicemail set up. ?

## 2021-05-08 NOTE — Telephone Encounter (Signed)
Appointment made by front desk.

## 2021-05-28 ENCOUNTER — Ambulatory Visit (INDEPENDENT_AMBULATORY_CARE_PROVIDER_SITE_OTHER): Payer: Medicare Other | Admitting: Gastroenterology

## 2021-05-28 ENCOUNTER — Encounter: Payer: Self-pay | Admitting: Gastroenterology

## 2021-05-28 VITALS — BP 124/84 | HR 112 | Ht 68.0 in | Wt 157.0 lb

## 2021-05-28 DIAGNOSIS — Z860101 Personal history of adenomatous and serrated colon polyps: Secondary | ICD-10-CM

## 2021-05-28 DIAGNOSIS — R1011 Right upper quadrant pain: Secondary | ICD-10-CM

## 2021-05-28 DIAGNOSIS — Z86010 Personal history of colonic polyps: Secondary | ICD-10-CM

## 2021-05-28 DIAGNOSIS — R109 Unspecified abdominal pain: Secondary | ICD-10-CM

## 2021-05-28 DIAGNOSIS — K5900 Constipation, unspecified: Secondary | ICD-10-CM | POA: Diagnosis not present

## 2021-05-28 MED ORDER — NA SULFATE-K SULFATE-MG SULF 17.5-3.13-1.6 GM/177ML PO SOLN: 1.0000 | Freq: Once | ORAL | 0 refills | Status: AC

## 2021-05-28 NOTE — Patient Instructions (Signed)
Start over the counter Miralax mixing 17 grams in 8 oz of juice/water 1-2 x daily.  ? ?You have been scheduled for an endoscopy and colonoscopy. Please follow the written instructions given to you at your visit today. ?Please pick up your prep supplies at the pharmacy within the next 1-3 days. ?If you use inhalers (even only as needed), please bring them with you on the day of your procedure. ? ?The [facility] providers would like to encourage you to use [facility] to communicate with providers for non-urgent requests or questions.  Due to long hold times on the telephone, sending your provider a message by [facility] may be a faster and more efficient way to get a response.  Please allow 48 business hours for a response.  Please remember that this is for non-urgent requests.  ? ?Due to recent changes in healthcare laws, you may see the results of your imaging and laboratory studies on MyChart before your provider has had a chance to review them.  We understand that in some cases there may be results that are confusing or concerning to you. Not all laboratory results come back in the same time frame and the provider may be waiting for multiple results in order to interpret others.  Please give [place] 48 hours in order for your provider to thoroughly review all the results before contacting the office for clarification of your results.  ? ?The [facility] providers would like to encourage you to use [facility] to communicate with providers for non-urgent requests or questions.  Due to long hold times on the telephone, sending your provider a message by [facility] may be a faster and more efficient way to get a response.  Please allow 48 business hours for a response.  Please remember that this is for non-urgent requests.  ? ?Thank you for choosing me and [facility]. ? ?Malcolm T. Dagoberto Ligas., MD., [facility] ? ?
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
## 2021-06-09 ENCOUNTER — Telehealth: Payer: Self-pay | Admitting: Gastroenterology

## 2021-06-09 NOTE — Telephone Encounter (Signed)
Left message for Curtis Kim to call back at [place].  ?

## 2021-06-09 NOTE — Telephone Encounter (Signed)
Inbound call from Leslie from [facility] calling to see if there was anyway patients procedure could be moved to any sooner. She stated that patient has been to the office 3 times in the last 2 weeks with stomach pain. Requesting a call back to discuss. Please advise. ?

## 2021-06-10 NOTE — Telephone Encounter (Signed)
There are currently no sooner times available for endo colon, but will continue to check for openings. ?

## 2021-06-17 ENCOUNTER — Emergency Department (HOSPITAL_COMMUNITY)
Admission: EM | Admit: 2021-06-17 | Discharge: 2021-06-17 | Disposition: A | Discharge status: Home / Self Care | Payer: Medicare Other | Attending: Emergency Medicine | Admitting: Emergency Medicine

## 2021-06-17 ENCOUNTER — Other Ambulatory Visit: Payer: Self-pay

## 2021-06-17 ENCOUNTER — Emergency Department (HOSPITAL_COMMUNITY): Payer: Medicare Other

## 2021-06-17 ENCOUNTER — Encounter (HOSPITAL_COMMUNITY): Payer: Self-pay

## 2021-06-17 DIAGNOSIS — K5900 Constipation, unspecified: Secondary | ICD-10-CM | POA: Insufficient documentation

## 2021-06-17 DIAGNOSIS — R1031 Right lower quadrant pain: Secondary | ICD-10-CM | POA: Insufficient documentation

## 2021-06-17 DIAGNOSIS — R1011 Right upper quadrant pain: Secondary | ICD-10-CM | POA: Insufficient documentation

## 2021-06-17 DIAGNOSIS — R112 Nausea with vomiting, unspecified: Secondary | ICD-10-CM | POA: Diagnosis not present

## 2021-06-17 LAB — URINALYSIS, ROUTINE W REFLEX MICROSCOPIC
Bacteria, UA: NONE SEEN
Bilirubin Urine: NEGATIVE
Glucose, UA: 50 mg/dL — AB
Hgb urine dipstick: NEGATIVE
Ketones, ur: NEGATIVE mg/dL
Leukocytes,Ua: NEGATIVE
Nitrite: NEGATIVE
Protein, ur: 30 mg/dL — AB
Specific Gravity, Urine: 1.013 (ref 1.005–1.030)
pH: 6 (ref 5.0–8.0)

## 2021-06-17 LAB — CBC
HCT: 45.1 % (ref 39.0–52.0)
Hemoglobin: 15.6 g/dL (ref 13.0–17.0)
MCH: 29.9 pg (ref 26.0–34.0)
MCHC: 34.6 g/dL (ref 30.0–36.0)
MCV: 86.4 fL (ref 80.0–100.0)
Platelets: 231 10*3/uL (ref 150–400)
RBC: 5.22 MIL/uL (ref 4.22–5.81)
RDW: 13.8 % (ref 11.5–15.5)
WBC: 9 10*3/uL (ref 4.0–10.5)
nRBC: 0 % (ref 0.0–0.2)

## 2021-06-17 LAB — COMPREHENSIVE METABOLIC PANEL
ALT: 16 U/L (ref 0–44)
AST: 19 U/L (ref 15–41)
Albumin: 4.2 g/dL (ref 3.5–5.0)
Alkaline Phosphatase: 99 U/L (ref 38–126)
Anion gap: 7 (ref 5–15)
BUN: 8 mg/dL (ref 8–23)
CO2: 25 mmol/L (ref 22–32)
Calcium: 9.4 mg/dL (ref 8.9–10.3)
Chloride: 105 mmol/L (ref 98–111)
Creatinine, Ser: 0.72 mg/dL (ref 0.61–1.24)
GFR, Estimated: >60 mL/min (ref >60)
Glucose, Bld: 74 mg/dL (ref 70–99)
Potassium: 3.9 mmol/L (ref 3.5–5.1)
Sodium: 137 mmol/L (ref 135–145)
Total Bilirubin: 0.8 mg/dL (ref 0.3–1.2)
Total Protein: 7.8 g/dL (ref 6.5–8.1)

## 2021-06-17 LAB — LIPASE, BLOOD: Lipase: 31 U/L (ref 11–51)

## 2021-06-17 MED ORDER — OXYCODONE-ACETAMINOPHEN 5-325 MG PO TABS
2.0000 | ORAL_TABLET | Freq: Once | ORAL | Status: AC
  Administered 2021-06-17: 2 via ORAL
  Filled 2021-06-17: qty 2

## 2021-06-17 MED ORDER — IOHEXOL 300 MG/ML  SOLN
100.0000 mL | Freq: Once | INTRAMUSCULAR | Status: AC | PRN
  Administered 2021-06-17: 100 mL via INTRAVENOUS

## 2021-06-17 MED ORDER — SODIUM CHLORIDE 0.9 % IV BOLUS
1000.0000 mL | Freq: Once | INTRAVENOUS | Status: AC
  Administered 2021-06-17: 1000 mL via INTRAVENOUS

## 2021-06-17 MED ORDER — ONDANSETRON HCL 4 MG/2ML IJ SOLN
4.0000 mg | Freq: Once | INTRAMUSCULAR | Status: AC
  Administered 2021-06-17: 4 mg via INTRAVENOUS
  Filled 2021-06-17: qty 2

## 2021-06-17 MED ORDER — MORPHINE SULFATE (PF) 4 MG/ML IV SOLN
4.0000 mg | Freq: Once | INTRAVENOUS | Status: AC
  Administered 2021-06-17: 4 mg via INTRAVENOUS
  Filled 2021-06-17: qty 1

## 2021-06-17 NOTE — ED Provider Triage Note (Signed)
Emergency Medicine Provider Triage Evaluation Note ? ?Coady Train , a 66 y.o. male  was evaluated in triage.  Pt complains of abdominal distention.  Patient is complaining of right side and left upper quadrant pain and nausea x1 week.  He saw a GI provider for similar symptoms at the end of March, and was started on Linzess.  He states since that visit he has had more abdominal distention and pain.  No aggravating or alleviating factors. He also reports a history of gallstones. ? ?Review of Systems  ?Positive: As above ?Negative: Fever, vomiting, diarrhea, urinary sx ? ?Physical Exam  ?BP 125/86 (BP Location: Right Arm)   Pulse (!) 120   Temp 98.4 ?F (36.9 ?C) (Oral)   Resp 18   SpO2 95%  ?Gen:   Awake, no distress   ?Resp:  Normal effort  ?MSK:   Moves extremities without difficulty  ?Other:   ? ?Medical Decision Making  ?Medically screening exam initiated at 4:51 PM.  Appropriate orders placed.  Aquan Kope was informed that the remainder of the evaluation will be completed by another provider, this initial triage assessment does not replace that evaluation, and the importance of remaining in the ED until their evaluation is complete. ? ? ?  ?Kateri Plummer, PA-C ?06/17/21 1651 ? ?

## 2021-06-17 NOTE — ED Provider Notes (Signed)
?[facility] DEPT ?Provider Note ? ? ?CSN: [id number] ?Arrival date & time: 06/17/21  1608 ? ?  ? ?History ? ?Chief Complaint  ?Patient presents with  ? Abdominal Pain  ? Nausea  ? ? ?Curtis Kim is a 66 y.o. male history of constipation, reflux, here presenting with abdominal pain.  Patient has been having ongoing right-sided abdominal pain for several weeks now.  Patient was seen in the ER about a month ago and had possible gallstones but no obvious acute cholecystitis.  Patient states that for the last week or so, he has been having worsening right lower quadrant pain.  Also associated with some vomiting as well.  He states that occasionally the pain radiate up to the right upper quadrant as well.  Patient was told that he is constipated and was started on Linzess with no relief.  ? ?The history is provided by the patient.  ? ?  ? ?Home Medications ?Prior to Admission medications   ?Medication Sig Start Date End Date Taking? Authorizing Provider  ?albuterol (PROVENTIL HFA;VENTOLIN HFA) 108 (90 Base) MCG/ACT inhaler Inhale 2 puffs into the lungs every 4 (four) hours as needed for wheezing or shortness of breath.    [provider]  ?ARIPiprazole (ABILIFY) 5 MG tablet Take 5 mg by mouth at bedtime.    [provider]  ?artificial tears (LACRILUBE) OINT ophthalmic ointment Place into the left eye 3 (three) times daily. 04/14/17   Fatima Blank, MD  ?baclofen (LIORESAL) 10 MG tablet Take 10 mg by mouth 2 (two) times daily as needed (for headaches).     [provider]  ?clonazePAM (KLONOPIN) 1 MG tablet Take 1 mg by mouth 2 (two) times daily as needed for anxiety.    [provider]  ?cyclobenzaprine (FLEXERIL) 10 MG tablet Take 10 mg by mouth 2 (two) times daily as needed for muscle spasms. 08/25/16   [provider]  ?cyclobenzaprine (FLEXERIL) 10 MG tablet Take 1 tablet (10 mg total) by mouth 3 (three) times daily as needed for  muscle spasms. 05/02/17   Street, [place], PA-C  ?docusate sodium (COLACE) 100 MG capsule Take 1 capsule (100 mg total) by mouth every 12 (twelve) hours. 06/01/16   Recardo Evangelist, PA-C  ?hydrOXYzine (ATARAX/VISTARIL) 25 MG tablet Take 25 mg by mouth every 6 (six) hours as needed for itching.    [provider]  ?imipramine (TOFRANIL) 25 MG tablet Take 50 mg by mouth daily as needed (for headaches).     [provider]  ?Insulin Glargine (LANTUS) 100 UNIT/ML Solostar Pen Inject 10 Units into the skin daily at 10 pm. 09/09/16   Oswald Hillock, MD  ?lisinopril-hydrochlorothiazide (PRINZIDE,ZESTORETIC) 10-12.5 MG per tablet Take 1 tablet by mouth 2 (two) times daily.    [provider]  ?loratadine (CLARITIN) 10 MG tablet Take 10 mg by mouth daily.    [provider]  ?metFORMIN (GLUCOPHAGE) 500 MG tablet Take 1 tablet (500 mg total) by mouth 2 (two) times daily with a meal. 09/09/16 09/09/17  Oswald Hillock, MD  ?naproxen (NAPROSYN) 500 MG tablet Take 1 tablet (500 mg total) by mouth 2 (two) times daily with a meal. 05/02/17   Street, [place], PA-C  ?pantoprazole (PROTONIX) 40 MG tablet Take 1 tablet (40 mg total) by mouth daily. 05/05/21   Varney Biles, MD  ?rosuvastatin (CRESTOR) 20 MG tablet Take 20 mg by mouth daily.    [provider]  ?sertraline (ZOLOFT) 100 MG  tablet Take 100 mg by mouth daily as needed (for depression).  09/05/16   [provider]  ?triamcinolone (KENALOG) 0.025 % ointment Apply 1 application topically 2 (two) times daily as needed. To chest and back 08/25/16   [provider]  ?UNABLE TO FIND Glucometer #1 ?Insulin pen needles # 1 box ?Glucose test strips #1 box ?Lancets #1 box 09/09/16   Oswald Hillock, MD  ?   ? ?Allergies    ?Patient has no known allergies.   ? ?Review of Systems   ?Review of Systems  ?Gastrointestinal:  Positive for abdominal pain and vomiting.  ?All other systems reviewed and are negative. ? ?Physical Exam ?Updated  Vital Signs ?BP (!) 152/92 (BP Location: Right Arm)   Pulse (!) 102   Temp 98.4 ?F (36.9 ?C) (Oral)   Resp 19   SpO2 97%  ?Physical Exam ?Vitals and nursing note reviewed.  ?HENT:  ?   Head: Normocephalic.  ?Eyes:  ?   Extraocular Movements: Extraocular movements intact.  ?   Pupils: Pupils are equal, round, and reactive to light.  ?Cardiovascular:  ?   Rate and Rhythm: Normal rate and regular rhythm.  ?   Heart sounds: Normal heart sounds.  ?Pulmonary:  ?   Effort: Pulmonary effort is normal.  ?   Breath sounds: Normal breath sounds.  ?Abdominal:  ?   General: Abdomen is flat.  ?   Comments: RLQ and RUQ tenderness   ?Skin: ?   General: Skin is warm.  ?   Capillary Refill: Capillary refill takes less than 2 seconds.  ?Neurological:  ?   General: No focal deficit present.  ?   Mental Status: He is alert and oriented to person, place, and time.  ? ? ?ED Results / Procedures / Treatments   ?Labs ?(all labs ordered are listed, but only abnormal results are displayed) ?Labs Reviewed  ?URINALYSIS, ROUTINE W REFLEX MICROSCOPIC - Abnormal; Notable for the following components:  ?    Result Value  ? Glucose, UA 50 (*)   ? Protein, ur 30 (*)   ? All other components within normal limits  ?LIPASE, BLOOD  ?COMPREHENSIVE METABOLIC PANEL  ?CBC  ? ? ?EKG ?None ? ?Radiology ?CT ABDOMEN PELVIS W CONTRAST ? ?Result Date: 06/17/2021 ?CLINICAL DATA:  Abdominal pain. EXAM: CT ABDOMEN AND PELVIS WITH CONTRAST TECHNIQUE: Multidetector CT imaging of the abdomen and pelvis was performed using the standard protocol following bolus administration of intravenous contrast. RADIATION DOSE REDUCTION: This exam was performed according to the departmental dose-optimization program which includes automated exposure control, adjustment of the mA and/or kV according to patient size and/or use of iterative reconstruction technique. CONTRAST:  19m OMNIPAQUE IOHEXOL 300 MG/ML  SOLN COMPARISON:  CT abdomen pelvis dated 05/05/2021. FINDINGS: Lower  chest: The visualized lung bases are clear. No intra-abdominal free air or free fluid. Hepatobiliary: The liver is unremarkable. No intrahepatic biliary dilatation. Multiple gallstones. No pericholecystic fluid or evidence of acute cholecystitis by CT. Pancreas: Unremarkable. No pancreatic ductal dilatation or surrounding inflammatory changes. Spleen: Normal in size without focal abnormality. Adrenals/Urinary Tract: The adrenal glands unremarkable. Postsurgical changes of the upper pole of the left kidney with associated dystrophic calcification similar to prior CT. The right kidney is unremarkable. There is no hydronephrosis on either side. The visualized ureters and urinary bladder appear unremarkable. Stomach/Bowel: There is no bowel obstruction or active inflammation. The appendix is not visualized with certainty. No inflammatory changes identified in the right lower quadrant. Vascular/Lymphatic:  Moderate aortoiliac atherosclerotic disease. The IVC is unremarkable. No portal venous gas. There is no adenopathy. Reproductive: The prostate and seminal vesicles are grossly unremarkable. No pelvic mass. Other: Evidence of bilateral inguinal hernia repair. There is a small fat containing right inguinal hernia. Musculoskeletal: Mild degenerative changes. Grade 1 L4-L5 anterolisthesis. No acute osseous pathology. IMPRESSION: 1. No acute intra-abdominal or pelvic pathology. 2. Cholelithiasis. 3. Aortic Atherosclerosis (ICD10-I70.0). Electronically Signed   By: Anner Crete M.D.   On: 06/17/2021 20:25  ? ?US Abdomen Limited RUQ (LIVER/GB) ? ?Result Date: 06/17/2021 ?CLINICAL DATA:  Image RIGHT upper quadrant pain EXAM: ULTRASOUND ABDOMEN LIMITED RIGHT UPPER QUADRANT COMPARISON:  CT 06/17/2021 FINDINGS: Gallbladder: Echogenic gallstones towards the gallbladder neck. No gallbladder distension or pericholecystic fluid. Negative sonographic Murphy's sign. Largest stone measures 9 mm. Common bile duct: Diameter: Upper  limits of normal at 6 mm Liver: No focal lesion identified. Within normal limits in parenchymal echogenicity. Portal vein is patent on color Doppler imaging with normal direction of blood flow towards the liver. Other

## 2021-06-17 NOTE — ED Triage Notes (Addendum)
Pt c/o R side and LUQ abdominal pain and nausea x1 week. Pain score 10/10.  Pt reports he saw a GI provider about same at the end of March.  Pt was started on Linzess.  Pt sts increased distention since GI visit.  ? ?Pt reports Hx of gall stones.     ?

## 2021-06-17 NOTE — Discharge Instructions (Signed)
Labs and CT were unremarkable. ? ?You can continue taking your morphine IR as prescribed by your pain management doctor.  If you need more pain medicine you can talk to your pain management doctor ? ?See your doctor for follow-up.  You will need to get endoscopy and colonoscopy as scheduled by your doctor ? ?Return to ER if you have worse abdominal pain, vomiting, fever ?  ?

## 2021-07-07 ENCOUNTER — Telehealth: Payer: Self-pay | Admitting: Gastroenterology

## 2021-07-07 MED ORDER — NA SULFATE-K SULFATE-MG SULF 17.5-3.13-1.6 GM/177ML PO SOLN: 1.0000 | Freq: Once | ORAL | 0 refills | Status: AC

## 2021-07-07 NOTE — Telephone Encounter (Signed)
Received a call from patient this morning asking if his prep had been called into his pharmacy as his procedure is on 5/11.  Please call patient and advise. ? ?Thank you. ?

## 2021-07-07 NOTE — Telephone Encounter (Signed)
Prescription resent to patient's pharmacy. Patient notified and patient verbalized understanding. ?

## 2021-07-07 NOTE — Telephone Encounter (Signed)
We received a call from patient regarding prep medication. Per patient, that medication is not covered by his insurance. Patient is seeking an alternative. Please advise. ?

## 2021-07-08 NOTE — Telephone Encounter (Signed)
Informed patient that he can come by our office to pick up a free prep kit (Plenvu) and new instructions for his procedure tomorrow. Patient agreed.  ?

## 2021-07-09 ENCOUNTER — Ambulatory Visit (AMBULATORY_SURGERY_CENTER): Payer: Medicare Other | Admitting: Gastroenterology

## 2021-07-09 ENCOUNTER — Encounter: Payer: Self-pay | Admitting: Gastroenterology

## 2021-07-09 VITALS — BP 144/79 | HR 90 | Temp 97.8°F | Resp 18 | Ht 68.0 in | Wt 157.0 lb

## 2021-07-09 DIAGNOSIS — R1011 Right upper quadrant pain: Secondary | ICD-10-CM | POA: Diagnosis not present

## 2021-07-09 DIAGNOSIS — D123 Benign neoplasm of transverse colon: Secondary | ICD-10-CM | POA: Diagnosis not present

## 2021-07-09 DIAGNOSIS — D124 Benign neoplasm of descending colon: Secondary | ICD-10-CM

## 2021-07-09 DIAGNOSIS — K317 Polyp of stomach and duodenum: Secondary | ICD-10-CM | POA: Diagnosis not present

## 2021-07-09 DIAGNOSIS — R933 Abnormal findings on diagnostic imaging of other parts of digestive tract: Secondary | ICD-10-CM | POA: Diagnosis not present

## 2021-07-09 DIAGNOSIS — Z86010 Personal history of colonic polyps: Secondary | ICD-10-CM | POA: Diagnosis present

## 2021-07-09 DIAGNOSIS — K449 Diaphragmatic hernia without obstruction or gangrene: Secondary | ICD-10-CM

## 2021-07-09 DIAGNOSIS — R109 Unspecified abdominal pain: Secondary | ICD-10-CM

## 2021-07-09 DIAGNOSIS — D125 Benign neoplasm of sigmoid colon: Secondary | ICD-10-CM | POA: Diagnosis not present

## 2021-07-09 DIAGNOSIS — Z800 Family history of malignant neoplasm of digestive organs: Secondary | ICD-10-CM | POA: Diagnosis not present

## 2021-07-09 MED ORDER — SODIUM CHLORIDE 0.9 % IV SOLN: 500.0000 mL | INTRAVENOUS | Status: DC

## 2021-07-09 NOTE — Progress Notes (Signed)
Pt's states no medical or surgical changes since previsit or office visit. 
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
## 2021-07-09 NOTE — Patient Instructions (Addendum)
Colonoscopy-Resume previous diet and medications. Awaiting pathology results. Likely repeat in 3 years for surveillance. ?Endoscopy-Awaiting pathology results ?YOU HAD AN ENDOSCOPIC PROCEDURE TODAY AT [facility]:   Refer to the procedure report that was given to you for any specific questions about what was found during the examination.  If the procedure report does not answer your questions, please call your gastroenterologist to clarify.  If you requested that your care partner not be given the details of your procedure findings, then the procedure report has been included in a sealed envelope for you to review at your convenience later. ? ?YOU SHOULD EXPECT: Some feelings of bloating in the abdomen. Passage of more gas than usual.  Walking can help get rid of the air that was put into your GI tract during the procedure and reduce the bloating. If you had a lower endoscopy (such as a colonoscopy or flexible sigmoidoscopy) you may notice spotting of blood in your stool or on the toilet paper. If you underwent a bowel prep for your procedure, you may not have a normal bowel movement for a few days. ? ?Please Note:  You might notice some irritation and congestion in your nose or some drainage.  This is from the oxygen used during your procedure.  There is no need for concern and it should clear up in a day or so. ? ?SYMPTOMS TO REPORT IMMEDIATELY: ? ?Following lower endoscopy (colonoscopy or flexible sigmoidoscopy): ? Excessive amounts of blood in the stool ? Significant tenderness or worsening of abdominal pains ? Swelling of the abdomen that is new, acute ? Fever of 100?F or higher ? ?Following upper endoscopy (EGD) ? Vomiting of blood or coffee ground material ? New chest pain or pain under the shoulder blades ? Painful or persistently difficult swallowing ? New shortness of breath ? Fever of 100?F or higher ? Black, tarry-looking stools ? ?For urgent or emergent issues, a gastroenterologist can  be reached at any hour by calling [phone]. ?Do not use MyChart messaging for urgent concerns.  ? ? ?DIET:  We do recommend a small meal at first, but then you may proceed to your regular diet.  Drink plenty of fluids but you should avoid alcoholic beverages for 24 hours. ? ?ACTIVITY:  You should plan to take it easy for the rest of today and you should NOT DRIVE or use heavy machinery until tomorrow (because of the sedation medicines used during the test).   ? ?FOLLOW UP: ?Our staff will call the number listed on your records 48-72 hours following your procedure to check on you and address any questions or concerns that you may have regarding the information given to you following your procedure. If we do not reach you, we will leave a message.  We will attempt to reach you two times.  During this call, we will ask if you have developed any symptoms of COVID 19. If you develop any symptoms (ie: fever, flu-like symptoms, shortness of breath, cough etc.) before then, please call [phone].  If you test positive for Covid 19 in the 2 weeks post procedure, please call and report this information to [place].   ? ?If any biopsies were taken you will be contacted by phone or by letter within the next 1-3 weeks.  Please call us at [phone] if you have not heard about the biopsies in 3 weeks.  ? ? ?SIGNATURES/CONFIDENTIALITY: ?You and/or your care partner have signed paperwork which will be entered into your  electronic medical record.  These signatures attest to the fact that that the information above on your After Visit Summary has been reviewed and is understood.  Full responsibility of the confidentiality of this discharge information lies with you and/or your care-partner.  ?

## 2021-07-09 NOTE — Op Note (Signed)
[place] ?Patient Name: Curtis Kim ?Procedure Date: 07/09/2021 2:04 PM ?MRN: [id number] ?Endoscopist: Ladene Artist , MD ?Age: 66 ?Referring MD:  ?Date of Birth: [birth year] ?Gender: Male ?Account #: [id number] ?Procedure:                Colonoscopy ?Indications:              Surveillance: Personal history of adenomatous  ?                          polyps on last colonoscopy > 5 years ago ?Medicines:                Monitored Anesthesia Care ?Procedure:                Pre-Anesthesia Assessment: ?                          - Prior to the procedure, a History and Physical  ?                          was performed, and patient medications and  ?                          allergies were reviewed. The patient's tolerance of  ?                          previous anesthesia was also reviewed. The risks  ?                          and benefits of the procedure and the sedation  ?                          options and risks were discussed with the patient.  ?                          All questions were answered, and informed consent  ?                          was obtained. Prior Anticoagulants: The patient has  ?                          taken no previous anticoagulant or antiplatelet  ?                          agents. ASA Grade Assessment: III - A patient with  ?                          severe systemic disease. After reviewing the risks  ?                          and benefits, the patient was deemed in  ?                          satisfactory condition to undergo the procedure. ?  After obtaining informed consent, the colonoscope  ?                          was passed under direct vision. Throughout the  ?                          procedure, the patient's blood pressure, pulse, and  ?                          oxygen saturations were monitored continuously. The  ?                          Olympus CF-HQ190L ([id number]) Colonoscope was  ?                          introduced through the  anus and advanced to the the  ?                          cecum, identified by appendiceal orifice and  ?                          ileocecal valve. The ileocecal valve, appendiceal  ?                          orifice, and rectum were photographed. The quality  ?                          of the bowel preparation was adequate after  ?                          extensive lavage, suction. The colonoscopy was  ?                          performed without difficulty. The patient tolerated  ?                          the procedure well. ?Scope In: 2:07:50 PM ?Scope Out: 2:34:36 PM ?Scope Withdrawal Time: 0 hours 22 minutes 22 seconds  ?Total Procedure Duration: 0 hours 26 minutes 46 seconds  ?Findings:                 The perianal and digital rectal examinations were  ?                          normal. ?                          Seven sessile polyps were found in the sigmoid  ?                          colon (1), descending colon (1) and transverse  ?                          colon (5). The polyps were 7 to 9 mm in size. These  ?  polyps were removed with a cold snare. Resection  ?                          and retrieval were complete. ?                          Internal hemorrhoids were found during  ?                          retroflexion. The hemorrhoids were moderate and  ?                          Grade I (internal hemorrhoids that do not prolapse). ?                          The exam was otherwise without abnormality on  ?                          direct and retroflexion views. ?Complications:            No immediate complications. Estimated blood loss:  ?                          None. ?Estimated Blood Loss:     Estimated blood loss: none. ?Impression:               - Seven 7 to 9 mm polyps in the sigmoid colon, in  ?                          the descending colon and in the transverse colon,  ?                          removed with a cold snare. Resected and retrieved. ?                           - Internal hemorrhoids. ?                          - The examination was otherwise normal on direct  ?                          and retroflexion views. ?Recommendation:           - Repeat colonoscopy, likely 3 years, after studies  ?                          are complete for surveillance based on pathology  ?                          results with a more extensive bowel prep. ?                          - Patient has a contact number available for  ?                          emergencies. The  signs and symptoms of potential  ?                          delayed complications were discussed with the  ?                          patient. Return to normal activities tomorrow.  ?                          Written discharge instructions were provided to the  ?                          patient. ?                          - Resume previous diet. ?                          - Continue present medications. ?                          - Await pathology results. ?Ladene Artist, MD ?07/09/2021 2:50:45 PM ?This report has been signed electronically. ?

## 2021-07-09 NOTE — Progress Notes (Signed)
Patient hypoglycemia treated with 350 cc D5w.  Blood sugar 208 after infusion. ?

## 2021-07-09 NOTE — Progress Notes (Signed)
Report to PACU, RN, vss, BBS= Clear.  

## 2021-07-09 NOTE — Progress Notes (Signed)
Called to room to assist during endoscopic procedure.  Patient ID and intended procedure confirmed with present staff. Received instructions for my participation in the procedure from the performing physician.  

## 2021-07-09 NOTE — Op Note (Signed)
[place] ?Patient Name: Curtis Kim ?Procedure Date: 07/09/2021 2:04 PM ?MRN: [id number] ?Endoscopist: Ladene Artist , MD ?Age: 66 ?Referring MD:  ?Date of Birth: Jun 22, 1955 ?Gender: Male ?Account #: [id number] ?Procedure:                Upper GI endoscopy ?Indications:              Abdominal pain in the right upper quadrant, Right  ?                          flank pain, Abnormal CT of the GI tract (stomach) ?Medicines:                Monitored Anesthesia Care ?Procedure:                Pre-Anesthesia Assessment: ?                          - Prior to the procedure, a History and Physical  ?                          was performed, and patient medications and  ?                          allergies were reviewed. The patient's tolerance of  ?                          previous anesthesia was also reviewed. The risks  ?                          and benefits of the procedure and the sedation  ?                          options and risks were discussed with the patient.  ?                          All questions were answered, and informed consent  ?                          was obtained. Prior Anticoagulants: The patient has  ?                          taken no previous anticoagulant or antiplatelet  ?                          agents. ASA Grade Assessment: III - A patient with  ?                          severe systemic disease. After reviewing the risks  ?                          and benefits, the patient was deemed in  ?                          satisfactory condition to undergo the procedure. ?  After obtaining informed consent, the endoscope was  ?                          passed under direct vision. Throughout the  ?                          procedure, the patient's blood pressure, pulse, and  ?                          oxygen saturations were monitored continuously. The  ?                          Endoscope was introduced through the mouth, and  ?                           advanced to the second part of duodenum. The upper  ?                          GI endoscopy was accomplished without difficulty.  ?                          The patient tolerated the procedure well. ?Scope In: ?Scope Out: ?Findings:                 The examined esophagus was normal. ?                          A small hiatal hernia was present. ?                          Diffuse prominent gastric folds were found in the  ?                          gastric fundus and in the gastric body. Biopsies  ?                          were taken with a cold forceps for histology. ?                          The exam of the stomach was otherwise normal. ?                          The duodenal bulb and second portion of the  ?                          duodenum were normal. ?Complications:            No immediate complications. ?Estimated Blood Loss:     Estimated blood loss was minimal. ?Impression:               - Normal esophagus. ?                          - Small hiatal hernia. ?                          -  Enlarged gastric folds. Biopsied. ?                          - Normal duodenal bulb and second portion of the  ?                          duodenum. ?Recommendation:           - Patient has a contact number available for  ?                          emergencies. The signs and symptoms of potential  ?                          delayed complications were discussed with the  ?                          patient. Return to normal activities tomorrow.  ?                          Written discharge instructions were provided to the  ?                          patient. ?                          - Resume previous diet. ?                          - Continue present medications. ?                          - Await pathology results. ?                          - No GI cause for abdominal pain found. ?Ladene Artist, MD ?07/09/2021 3:03:30 PM ?This report has been signed electronically. ?

## 2021-07-13 ENCOUNTER — Telehealth: Payer: Self-pay | Admitting: *Deleted

## 2021-07-13 NOTE — Telephone Encounter (Signed)
?  Follow up Call- ? ? ?  07/09/2021  ?  1:29 PM  ?Call back number  ?Post procedure Call Back phone  # [phone]  ?Permission to leave phone message Yes  ?  ? ?Patient questions: ? ?Do you have a fever, pain , or abdominal swelling? No. ?Pain Score  0 * ? ?Have you tolerated food without any problems? Yes.   ? ?Have you been able to return to your normal activities? Yes.   ? ?Do you have any questions about your discharge instructions: ?Diet   No. ?Medications  No. ?Follow up visit  No. ? ?Do you have questions or concerns about your Care? No. ? ?Actions: ?* If pain score is 4 or above: ?No action needed, pain <4. ? ? ?

## 2021-07-23 ENCOUNTER — Encounter: Payer: Self-pay | Admitting: Gastroenterology

## 2021-08-03 ENCOUNTER — Telehealth: Payer: Self-pay | Admitting: Gastroenterology

## 2021-08-03 NOTE — Telephone Encounter (Signed)
There were no GI findings to explain his pain. I am concerned about referred pain from his back, possibly radiculopathy or musculoskeletal pain. Recommend that he returns to see the providers who manage his back pain.

## 2021-08-03 NOTE — Telephone Encounter (Signed)
Patient had colon/egd recently and is requesting the results.  Please call and advise.  Thank you.

## 2021-08-03 NOTE — Telephone Encounter (Signed)
Patient called in requesting biopsy results from recent procedure. Letter was sent home to patient & results have been given to patient. He mentions he is still having constant, generalized abdominal pain (10/10) & poor appetite, unrelieved by pain medication (morphine-MSIR PRN). He says it feels sore.  BM's have been normal, most recent was today 08/03/21. He takes Linzess daily. He was last seen for endo colon on 07/09/21 with Dr. Fuller Plan, and would like to know if there is anything else that he can do to help with symptoms. Will route to MD.

## 2021-08-03 NOTE — Telephone Encounter (Signed)
Spoke with patient regard MD recommendations. Patient verbalized understanding, no further questions.

## 2023-07-02 ENCOUNTER — Other Ambulatory Visit: Payer: Self-pay

## 2023-07-02 ENCOUNTER — Encounter (HOSPITAL_COMMUNITY): Payer: Self-pay | Admitting: Emergency Medicine

## 2023-07-02 ENCOUNTER — Ambulatory Visit (INDEPENDENT_AMBULATORY_CARE_PROVIDER_SITE_OTHER)

## 2023-07-02 ENCOUNTER — Ambulatory Visit (HOSPITAL_COMMUNITY)
Admission: EM | Admit: 2023-07-02 | Discharge: 2023-07-02 | Disposition: A | Discharge status: Home / Self Care | Attending: Family Medicine | Admitting: Family Medicine

## 2023-07-02 DIAGNOSIS — M79645 Pain in left finger(s): Secondary | ICD-10-CM

## 2023-07-02 DIAGNOSIS — S61012A Laceration without foreign body of left thumb without damage to nail, initial encounter: Secondary | ICD-10-CM

## 2023-07-02 DIAGNOSIS — S6010XA Contusion of unspecified finger with damage to nail, initial encounter: Secondary | ICD-10-CM

## 2023-07-02 MED ORDER — LIDOCAINE HCL 2 % IJ SOLN
INTRAMUSCULAR | Status: AC
  Filled 2023-07-02: qty 20

## 2023-07-02 MED ORDER — DOXYCYCLINE HYCLATE 100 MG PO CAPS: 100.0000 mg | ORAL_CAPSULE | Freq: Two times a day (BID) | ORAL | 0 refills | Status: AC

## 2023-07-02 NOTE — ED Triage Notes (Signed)
 Caught left thumb in car door today.  Patient is a diabetic.
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
